# Patient Record
Sex: Male | Born: 1980 | Race: White | Hispanic: No | Marital: Married | Smoking: Never smoker
Health system: Southern US, Community
[De-identification: ages and names within clinical notes are randomized; demographics above are authoritative.]

## PROBLEM LIST (undated history)

## (undated) DIAGNOSIS — F419 Anxiety disorder, unspecified: Secondary | ICD-10-CM

## (undated) HISTORY — PX: KNEE SURGERY: SHX244

---

## 1998-10-08 ENCOUNTER — Emergency Department (HOSPITAL_COMMUNITY): Admission: EM | Admit: 1998-10-08 | Discharge: 1998-10-08 | Payer: Self-pay | Admitting: Emergency Medicine

## 1998-10-08 ENCOUNTER — Encounter: Payer: Self-pay | Admitting: Emergency Medicine

## 2000-04-06 ENCOUNTER — Inpatient Hospital Stay (HOSPITAL_COMMUNITY): Admission: EM | Admit: 2000-04-06 | Discharge: 2000-04-07 | Payer: Self-pay | Admitting: Emergency Medicine

## 2000-04-06 ENCOUNTER — Encounter: Payer: Self-pay | Admitting: Emergency Medicine

## 2006-04-26 ENCOUNTER — Emergency Department (HOSPITAL_COMMUNITY): Admission: EM | Admit: 2006-04-26 | Discharge: 2006-04-27 | Payer: Self-pay | Admitting: Emergency Medicine

## 2007-07-15 ENCOUNTER — Emergency Department (HOSPITAL_COMMUNITY): Admission: EM | Admit: 2007-07-15 | Discharge: 2007-07-15 | Payer: Self-pay | Admitting: Emergency Medicine

## 2008-07-31 ENCOUNTER — Emergency Department (HOSPITAL_COMMUNITY): Admission: EM | Admit: 2008-07-31 | Discharge: 2008-07-31 | Payer: Self-pay | Admitting: Emergency Medicine

## 2010-09-18 ENCOUNTER — Ambulatory Visit (HOSPITAL_COMMUNITY): Admission: RE | Admit: 2010-09-18 | Discharge: 2010-09-18 | Payer: Self-pay | Admitting: Family Medicine

## 2011-02-21 ENCOUNTER — Ambulatory Visit: Payer: Medicaid Other | Attending: Orthopaedic Surgery

## 2011-02-21 DIAGNOSIS — IMO0001 Reserved for inherently not codable concepts without codable children: Secondary | ICD-10-CM | POA: Insufficient documentation

## 2011-02-21 DIAGNOSIS — M6281 Muscle weakness (generalized): Secondary | ICD-10-CM | POA: Insufficient documentation

## 2011-02-21 DIAGNOSIS — R5381 Other malaise: Secondary | ICD-10-CM | POA: Insufficient documentation

## 2011-02-21 DIAGNOSIS — R269 Unspecified abnormalities of gait and mobility: Secondary | ICD-10-CM | POA: Insufficient documentation

## 2011-02-21 DIAGNOSIS — M25569 Pain in unspecified knee: Secondary | ICD-10-CM | POA: Insufficient documentation

## 2011-03-07 ENCOUNTER — Ambulatory Visit: Payer: Medicaid Other | Attending: Orthopaedic Surgery | Admitting: Physical Therapy

## 2011-03-07 DIAGNOSIS — M6281 Muscle weakness (generalized): Secondary | ICD-10-CM | POA: Insufficient documentation

## 2011-03-07 DIAGNOSIS — R5381 Other malaise: Secondary | ICD-10-CM | POA: Insufficient documentation

## 2011-03-07 DIAGNOSIS — R269 Unspecified abnormalities of gait and mobility: Secondary | ICD-10-CM | POA: Insufficient documentation

## 2011-03-07 DIAGNOSIS — M25569 Pain in unspecified knee: Secondary | ICD-10-CM | POA: Insufficient documentation

## 2011-03-07 DIAGNOSIS — IMO0001 Reserved for inherently not codable concepts without codable children: Secondary | ICD-10-CM | POA: Insufficient documentation

## 2011-03-21 ENCOUNTER — Ambulatory Visit: Payer: Medicaid Other | Admitting: Physical Therapy
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
## 2011-09-16 LAB — URINALYSIS, ROUTINE W REFLEX MICROSCOPIC
Bilirubin Urine: NEGATIVE
Hgb urine dipstick: NEGATIVE
Ketones, ur: NEGATIVE
Specific Gravity, Urine: 1.02
pH: 6

## 2012-06-16 ENCOUNTER — Emergency Department (HOSPITAL_COMMUNITY): Payer: Self-pay

## 2012-06-16 ENCOUNTER — Emergency Department (HOSPITAL_COMMUNITY)
Admission: EM | Admit: 2012-06-16 | Discharge: 2012-06-16 | Disposition: A | Discharge status: Home / Self Care | Payer: Self-pay | Attending: Emergency Medicine | Admitting: Emergency Medicine

## 2012-06-16 ENCOUNTER — Encounter (HOSPITAL_COMMUNITY): Payer: Self-pay | Admitting: *Deleted

## 2012-06-16 DIAGNOSIS — R110 Nausea: Secondary | ICD-10-CM | POA: Insufficient documentation

## 2012-06-16 DIAGNOSIS — R109 Unspecified abdominal pain: Secondary | ICD-10-CM

## 2012-06-16 DIAGNOSIS — R000 Tachycardia, unspecified: Secondary | ICD-10-CM | POA: Insufficient documentation

## 2012-06-16 DIAGNOSIS — R509 Fever, unspecified: Secondary | ICD-10-CM | POA: Insufficient documentation

## 2012-06-16 DIAGNOSIS — K2970 Gastritis, unspecified, without bleeding: Secondary | ICD-10-CM | POA: Insufficient documentation

## 2012-06-16 DIAGNOSIS — R1011 Right upper quadrant pain: Secondary | ICD-10-CM | POA: Insufficient documentation

## 2012-06-16 LAB — CBC
Hemoglobin: 15.2 g/dL (ref 13.0–17.0)
MCH: 26.5 pg (ref 26.0–34.0)
MCHC: 32.7 g/dL (ref 30.0–36.0)
MCV: 81 fL (ref 78.0–100.0)
RBC: 5.74 MIL/uL (ref 4.22–5.81)

## 2012-06-16 LAB — COMPREHENSIVE METABOLIC PANEL
Alkaline Phosphatase: 57 U/L (ref 39–117)
BUN: 9 mg/dL (ref 6–23)
Calcium: 9.6 mg/dL (ref 8.4–10.5)
Creatinine, Ser: 1.19 mg/dL (ref 0.50–1.35)
GFR calc Af Amer: >90 mL/min (ref >90)
Glucose, Bld: 92 mg/dL (ref 70–99)
Total Protein: 7.6 g/dL (ref 6.0–8.3)

## 2012-06-16 LAB — URINALYSIS, ROUTINE W REFLEX MICROSCOPIC
Ketones, ur: NEGATIVE mg/dL
Leukocytes, UA: NEGATIVE
Nitrite: NEGATIVE
Specific Gravity, Urine: 1.018 (ref 1.005–1.030)
pH: 6 (ref 5.0–8.0)

## 2012-06-16 LAB — DIFFERENTIAL
Basophils Relative: 0 % (ref 0–1)
Eosinophils Absolute: 0 10*3/uL (ref 0.0–0.7)
Eosinophils Relative: 0 % (ref 0–5)
Lymphs Abs: 0.7 10*3/uL (ref 0.7–4.0)
Monocytes Absolute: 1 10*3/uL (ref 0.1–1.0)
Monocytes Relative: 19 % — ABNORMAL HIGH (ref 3–12)

## 2012-06-16 LAB — LIPASE, BLOOD: Lipase: 36 U/L (ref 11–59)

## 2012-06-16 MED ORDER — HYDROMORPHONE HCL PF 1 MG/ML IJ SOLN
1.0000 mg | Freq: Once | INTRAMUSCULAR | Status: AC
  Administered 2012-06-16: 1 mg via INTRAVENOUS
  Filled 2012-06-16: qty 1

## 2012-06-16 MED ORDER — ONDANSETRON HCL 4 MG/2ML IJ SOLN
4.0000 mg | Freq: Once | INTRAMUSCULAR | Status: AC
  Administered 2012-06-16: 4 mg via INTRAVENOUS
  Filled 2012-06-16: qty 2

## 2012-06-16 MED ORDER — OXYCODONE HCL 5 MG PO TABS: 5.0000 mg | ORAL_TABLET | ORAL | Status: AC | PRN

## 2012-06-16 MED ORDER — SODIUM CHLORIDE 0.9 % IV BOLUS (SEPSIS)
1000.0000 mL | Freq: Once | INTRAVENOUS | Status: AC
  Administered 2012-06-16: 1000 mL via INTRAVENOUS

## 2012-06-16 MED ORDER — SUCRALFATE 1 GM/10ML PO SUSP: 1.0000 g | Freq: Four times a day (QID) | ORAL | Status: DC

## 2012-06-16 MED ORDER — GI COCKTAIL ~~LOC~~
30.0000 mL | Freq: Once | ORAL | Status: AC
  Administered 2012-06-16: 30 mL via ORAL
  Filled 2012-06-16: qty 30

## 2012-06-16 MED ORDER — FAMOTIDINE-CA CARB-MAG HYDROX 10-800-165 MG PO CHEW: 1.0000 | CHEWABLE_TABLET | Freq: Two times a day (BID) | ORAL | Status: DC

## 2012-06-16 NOTE — ED Notes (Signed)
Pt reports right upper quadrant pain x couple of weeks. Nausea, no appetite. Fever 99.9. States "googled symptoms and thinks I have gallstones". Hx of enlarged liver.

## 2012-06-16 NOTE — ED Provider Notes (Addendum)
History     CSN: [id number]  Arrival date & time 06/16/12  1505   First MD Initiated Contact with Patient 06/16/12 1547      Chief Complaint  Patient presents with  . Abdominal Pain    (Consider location/radiation/quality/duration/timing/severity/associated sxs/prior treatment) Patient is a 31 y.o. male presenting with abdominal pain. The history is provided by the patient.  Abdominal Pain The primary symptoms of the illness include abdominal pain, fever and nausea. The primary symptoms of the illness do not include vomiting or diarrhea. Episode onset: 2 weeks ago intermittent but worse over the last 2 days. The onset of the illness was gradual. The problem has been gradually worsening.  The abdominal pain is located in the RUQ. The abdominal pain does not radiate. The severity of the abdominal pain is 9/10. The abdominal pain is relieved by nothing. The abdominal pain is exacerbated by movement, eating and certain positions.  The fever began 2 days ago. The fever has been gradually worsening since its onset. The maximum temperature recorded prior to his arrival was 100 to 100.9 F. The temperature was taken by an oral thermometer.  Associated with: last alcohol use was 4 days ago and drank 7 beers. The patient has not had a change in bowel habit. Additional symptoms associated with the illness include chills and anorexia. Symptoms associated with the illness do not include constipation, urgency, frequency or back pain. Associated medical issues comments: fatty liver discovered 3 years ago.    History reviewed. No pertinent past medical history.  Past Surgical History  Procedure Date  . Knee surgery     No family history on file.  History  Substance Use Topics  . Smoking status: Never Smoker   . Smokeless tobacco: Not on file  . Alcohol Use: Yes      Review of Systems  Constitutional: Positive for fever and chills.  Gastrointestinal: Positive for nausea, abdominal pain and  anorexia. Negative for vomiting, diarrhea and constipation.  Genitourinary: Negative for urgency and frequency.  Musculoskeletal: Negative for back pain.  All other systems reviewed and are negative.    Allergies  Review of patient's allergies indicates no known allergies.  Home Medications  No current outpatient prescriptions on file.  BP 148/84  Pulse 115  Temp 99.5 F (37.5 C) (Oral)  Resp 16  SpO2 100%  Physical Exam  Nursing note and vitals reviewed. Constitutional: He is oriented to person, place, and time. He appears well-developed and well-nourished. No distress.  HENT:  Head: Normocephalic and atraumatic.  Mouth/Throat: Oropharynx is clear and moist.  Eyes: Conjunctivae and EOM are normal. Pupils are equal, round, and reactive to light.  Neck: Normal range of motion. Neck supple.  Cardiovascular: Normal rate, regular rhythm and intact distal pulses.   No murmur heard. Pulmonary/Chest: Effort normal and breath sounds normal. No respiratory distress. He has no wheezes. He has no rales.  Abdominal: Soft. Normal appearance. He exhibits no distension. There is tenderness in the right upper quadrant. There is guarding and positive Murphy's sign. There is no rebound and no CVA tenderness. No hernia.  Musculoskeletal: Normal range of motion. He exhibits no edema and no tenderness.  Neurological: He is alert and oriented to person, place, and time.  Skin: Skin is warm and dry. No rash noted. No erythema.  Psychiatric: He has a normal mood and affect. His behavior is normal.    ED Course  Procedures (including critical care time)  Labs Reviewed  DIFFERENTIAL - Abnormal; Notable  for the following:    Monocytes Relative 19 (*)     All other components within normal limits  COMPREHENSIVE METABOLIC PANEL - Abnormal; Notable for the following:    GFR calc non Af Amer 79 (*)     All other components within normal limits  CBC  LIPASE, BLOOD  URINALYSIS, ROUTINE W REFLEX  MICROSCOPIC   Dg Chest 2 View  06/16/2012  *RADIOLOGY REPORT*  Clinical Data:  Fever and abdominal pain.  CHEST - 2 VIEW  Comparison: 07/15/2007  Findings: The heart size and mediastinal contours are within normal limits.  Both lungs are clear.  The visualized skeletal structures are unremarkable.  IMPRESSION: No active disease.  Original Report Authenticated By: Reola Calkins, M.D.   US Abdomen Complete  06/16/2012  *RADIOLOGY REPORT*  Clinical Data:  Abdominal pain and nausea.  COMPLETE ABDOMINAL ULTRASOUND  Comparison:  CT scan dated 07/15/2007  Findings:  Gallbladder:  No gallstones, gallbladder wall thickening, or pericholecystic fluid. Negative sonographic Murphy's sign.  Common bile duct:  Normal.  1.8 mm in diameter.  Liver:  Normal.  IVC:  Normal.  Pancreas:  Normal.  Spleen:  Splenomegaly.  Length is 15.3 cm. Volume is 72 cc.  Right Kidney:  Normal.  11.0 cm in length.  Left Kidney:  Normal.  10.8 cm in length.  Abdominal aorta:  No aneurysm identified.  Maximum diameter is 2.1 cm.  The mid to distal aorta is obscured by bowel gas.  IMPRESSION: Slight nonspecific splenomegaly.  Distal aorta is not visualized.  Original Report Authenticated By: Gwynn Burly, M.D.     1. Abdominal  pain, other specified site       MDM   With abdominal pain, fever and tachycardia.  Pain is in the right upper pain is most consistent with cholecystitis. Patient states the symptoms have been intermittent for 2 weeks but worse in the last 24 hours. Patient does have a history of fatty liver and isn't intermittent heavy drinker. His symptoms are not consistent with pancreatitis at this time. The last time he drank any alcohol was noted when he drank 8 beers.  patient is mildly tachycardic on exam and has a low-grade fever.  Get right upper quadrant tenderness on exam.  CBC, CMP, lipase, ultrasound of the abdomen pending. Patient given IV fluids, pain and nausea control.   5:30 PM Pt with normal U/S  without acute findings.  Pt states he persists with pain.  No sx suggestive of PE and PERC neg.  Will get CXR to r/o pathology in the [facility].  Pt may be having gastritis.  5:56 PM CXR wnl.  Will d/c home with ttx for gastritis and will have f/u with GI.  Gwyneth Sprout, MD 06/16/12 1757  Gwyneth Sprout, MD 06/16/12 1610

## 2012-08-06 ENCOUNTER — Emergency Department (HOSPITAL_COMMUNITY)
Admission: EM | Admit: 2012-08-06 | Discharge: 2012-08-06 | Disposition: A | Discharge status: Home / Self Care | Payer: Self-pay | Attending: Emergency Medicine | Admitting: Emergency Medicine

## 2012-08-06 ENCOUNTER — Encounter (HOSPITAL_COMMUNITY): Payer: Self-pay | Admitting: Emergency Medicine

## 2012-08-06 DIAGNOSIS — IMO0002 Reserved for concepts with insufficient information to code with codable children: Secondary | ICD-10-CM | POA: Insufficient documentation

## 2012-08-06 DIAGNOSIS — L0390 Cellulitis, unspecified: Secondary | ICD-10-CM

## 2012-08-06 MED ORDER — HYDROCODONE-ACETAMINOPHEN 5-325 MG PO TABS: 1.0000 | ORAL_TABLET | ORAL | Status: AC | PRN

## 2012-08-06 MED ORDER — SULFAMETHOXAZOLE-TRIMETHOPRIM 800-160 MG PO TABS: 2.0000 | ORAL_TABLET | Freq: Two times a day (BID) | ORAL | Status: AC

## 2012-08-06 NOTE — ED Provider Notes (Signed)
History     CSN: [id number]  Arrival date & time 08/06/12  4098   First MD Initiated Contact with Patient 08/06/12 0830      Chief Complaint  Patient presents with  . arm infection     (Consider location/radiation/quality/duration/timing/severity/associated sxs/prior treatment) HPI Comments: Patient presents with six red spots on his right forearm. Patient states his wife had an abscess on her knee 4 weeks ago, came to the ED and had it drained and it healed. Patient notes a few days later he had a red spot on his arm that he squeezed draining "pus and blood". A second one popped up after the initial one healed, then a third. Patient thought the 3 spots were healed and shaved his arm one week ago and 3 new spots appeared in the past 3 days. He has been washing his arm with antibacterial soap, cleaning each area with hydrogen peroxide, alcohol, and epsom salts, then covering each area with manuka honey and bandages. Patient used SilvaSorb on one spot that he believes helped it heal. Patient states he is worried that it could be MRSA. He has been taking his wife's Vicodin for the pain, pain is described as a pressure at the spots. He also notes some swelling in his right forearm since last night. Denies fevers, chills, N/V/D, tingling or numbness in his right hand.  The history is provided by the patient.    History reviewed. No pertinent past medical history.  Past Surgical History  Procedure Date  . Knee surgery     History reviewed. No pertinent family history.  History  Substance Use Topics  . Smoking status: Never Smoker   . Smokeless tobacco: Not on file  . Alcohol Use: Yes     socially      Review of Systems  Constitutional: Negative for fever and chills.  Gastrointestinal: Negative for nausea, vomiting and diarrhea.  Neurological: Negative for weakness and numbness.    Allergies  Review of patient's allergies indicates no known allergies.  Home Medications    Current Outpatient Rx  Name Route Sig Dispense Refill  . CLONAZEPAM 1 MG PO TABS Oral Take 1 mg by mouth 2 (two) times daily as needed.     Marland Kitchen DEXMETHYLPHENIDATE HCL 10 MG PO TABS Oral Take 10 mg by mouth daily as needed. For attention disorder    . HYDROCODONE-ACETAMINOPHEN 5-500 MG PO TABS Oral Take 1 tablet by mouth every 6 (six) hours as needed. One time dose prn pain. Pt used med from old rx    . LAMOTRIGINE 100 MG PO TABS Oral Take 50 mg by mouth daily.     Marland Kitchen OMEPRAZOLE 10 MG PO CPDR Oral Take 10 mg by mouth daily.    . TESTOSTERONE CYPIONATE 100 MG/ML IM OIL Intramuscular Inject 120 mg into the muscle See admin instructions. For IM use only 120 mg every 5 days      BP 139/56  Pulse 95  Temp 98 F (36.7 C) (Oral)  Resp 16  SpO2 100%  Physical Exam  Nursing note and vitals reviewed. Constitutional: He appears well-developed and well-nourished. No distress.  HENT:  Head: Normocephalic and atraumatic.  Neck: Neck supple.  Pulmonary/Chest: Effort normal.  Neurological: He is alert.  Skin: He is not diaphoretic.       Multiple pustular lesions of right dorsal forearm in various stages of healing.  At distal forearm there is a larger lesion with induration and surrounding erythema.  ED Course  Procedures (including critical care time)  Labs Reviewed - No data to display No results found.  INCISION AND DRAINAGE Performed by: Trixie Dredge B Consent: Verbal consent obtained. Risks and benefits: risks, benefits and alternatives were discussed Type: abscess  Body area: right dorsal forearm  Anesthesia: local infiltration  Local anesthetic: lidocaine 2% no epinephrine  Anesthetic total: 3 ml  Complexity: complex Blunt dissection to break up loculations  Drainage: bloody with flecks of purulence  Drainage amount: small  Packing material: none  Patient tolerance: Patient tolerated the procedure well with no immediate complications.     1. Cellulitis and  abscess       MDM  Pt with multiple areas of abscess, most have drained spontaneously, a larger one over distal dorsal forearm I&D'd in ED.  Also has surrounding cellulitis.  Pt is afebrile, nontoxic.  Not immunocompromised.  D/C home with bactrim and norco, 2 day follow up.  Discussed diagnosis and care plan with patient.  Pt given return precautions.  Pt verbalizes understanding and agrees with plan.           [place], [place] 08/06/12 1011

## 2012-08-06 NOTE — ED Notes (Signed)
Has 6 spots on right arm from abcesses- one healed, green drainage, wife had abscess on knee 1 month ago-- was treated, but did not take all antibiotics, pt had area start on right forearm 2-3 weeks ago, now has 5-6 abscess areas. Shaved arms with electric razor a few days ago. Swelling at wrist area.

## 2012-08-06 NOTE — ED Provider Notes (Signed)
Medical screening examination/treatment/procedure(s) were performed by non-physician practitioner and as supervising physician I was immediately available for consultation/collaboration.   Zamier Eggebrecht E Eyva Califano, MD 08/06/12 1531 

## 2012-08-06 NOTE — Discharge Instructions (Signed)
Read the information below.  Please see your doctor, the urgent care, or return to the ER in two days for a recheck of your arm.  Use warm moist compresses on your arm several times a day to encourage drainage.  If you develop fevers, spreading redness, worsening swelling, or uncontrolled pain, return to the ER immediately for a recheck.  Use the prescribed medication as directed.  Please discuss all new medications with your pharmacist.  Do not take additional tylenol while taking the prescribed pain medication to avoid overdose.  You may return to the Emergency Department at any time for worsening condition or any new symptoms that concern you.    Abscess An abscess (boil or furuncle) is an infected area that contains a collection of pus.  SYMPTOMS Signs and symptoms of an abscess include pain, tenderness, redness, or hardness. You may feel a moveable soft area under your skin. An abscess can occur anywhere in the body.  TREATMENT  A surgical cut (incision) may be made over your abscess to drain the pus. Gauze may be packed into the space or a drain may be looped through the abscess cavity (pocket). This provides a drain that will allow the cavity to heal from the inside outwards. The abscess may be painful for a few days, but should feel much better if it was drained.  Your abscess, if seen early, may not have localized and may not have been drained. If not, another appointment may be required if it does not get better on its own or with medications. HOME CARE INSTRUCTIONS   Only take over-the-counter or prescription medicines for pain, discomfort, or fever as directed by your caregiver.   Take your antibiotics as directed if they were prescribed. Finish them even if you start to feel better.   Keep the skin and clothes clean around your abscess.   If the abscess was drained, you will need to use gauze dressing to collect any draining pus. Dressings will typically need to be changed 3 or more  times a day.   The infection may spread by skin contact with others. Avoid skin contact as much as possible.   Practice good hygiene. This includes regular hand washing, cover any draining skin lesions, and do not share personal care items.   If you participate in sports, do not share athletic equipment, towels, whirlpools, or personal care items. Shower after every practice or tournament.   If a draining area cannot be adequately covered:   Do not participate in sports.   Children should not participate in day care until the wound has healed or drainage stops.   If your caregiver has given you a follow-up appointment, it is very important to keep that appointment. Not keeping the appointment could result in a much worse infection, chronic or permanent injury, pain, and disability. If there is any problem keeping the appointment, you must call back to this facility for assistance.  SEEK MEDICAL CARE IF:   You develop increased pain, swelling, redness, drainage, or bleeding in the wound site.   You develop signs of generalized infection including muscle aches, chills, fever, or a general ill feeling.   You have an oral temperature above 102 F (38.9 C).  MAKE SURE YOU:   Understand these instructions.   Will watch your condition.   Will get help right away if you are not doing well or get worse.  Document Released: 08/28/2005 Document Revised: 11/07/2011 Document Reviewed: 06/21/2008 [facility] Patient Information 2012 [employer].  Cellulitis Cellulitis is an infection of the skin and the tissue beneath it. The area is typically red and tender. It is caused by germs (bacteria) (usually staph or strep) that enter the body through cuts or sores. Cellulitis most commonly occurs in the arms or lower legs.  HOME CARE INSTRUCTIONS   If you are given a prescription for medications which kill germs (antibiotics), take as directed until finished.   If the infection is on the arm or  leg, keep the limb elevated as able.   Use a warm cloth several times per day to relieve pain and encourage healing.   See your caregiver for recheck of the infected site as directed if problems arise.   Only take over-the-counter or prescription medicines for pain, discomfort, or fever as directed by your caregiver.  SEEK MEDICAL CARE IF:   The area of redness (inflammation) is spreading, there are red streaks coming from the infected site, or if a part of the infection begins to turn dark in color.   The joint or bone underneath the infected skin becomes painful after the skin has healed.   The infection returns in the same or another area after it seems to have gone away.   A boil or bump swells up. This may be an abscess.   New, unexplained problems such as pain or fever develop.  SEEK IMMEDIATE MEDICAL CARE IF:   You have a fever.   You or your child feels drowsy or lethargic.   There is vomiting, diarrhea, or lasting discomfort or feeling ill (malaise) with muscle aches and pains.  MAKE SURE YOU:   Understand these instructions.   Will watch your condition.   Will get help right away if you are not doing well or get worse.  Document Released: 08/28/2005 Document Revised: 11/07/2011 Document Reviewed: 07/06/2008 [facility] Patient Information 2012 [place], [place].

## 2013-04-21 ENCOUNTER — Telehealth: Payer: Self-pay | Admitting: Family Medicine

## 2013-04-21 NOTE — Telephone Encounter (Signed)
Pt would like to be a patient of yours. Pt's wife said he had been a patient of yours years ago, but we cannot find any record of him. Also, this pt comes up as being discharged from the practice, but we cannot find a discharge letter. Pt is SELF PAY.  Pt's wife, Jayson Waterhouse had asked to be your patient, also self pay. Mrs Diehl said she would be willing to give up her appointment and let him be the new pt and take her appt day and time if you will approve. Pls advise.

## 2013-05-07 NOTE — Telephone Encounter (Signed)
Per Dr Tawanna Cooler, he will not accept as a patient.

## 2013-10-10 IMAGING — CR DG CHEST 2V
2 series · 2 of 2 positions shown · non-contrast
Comparison: 07/15/2007

CLINICAL DATA: Fever and abdominal pain.

CHEST - 2 VIEW

[w chest pa]
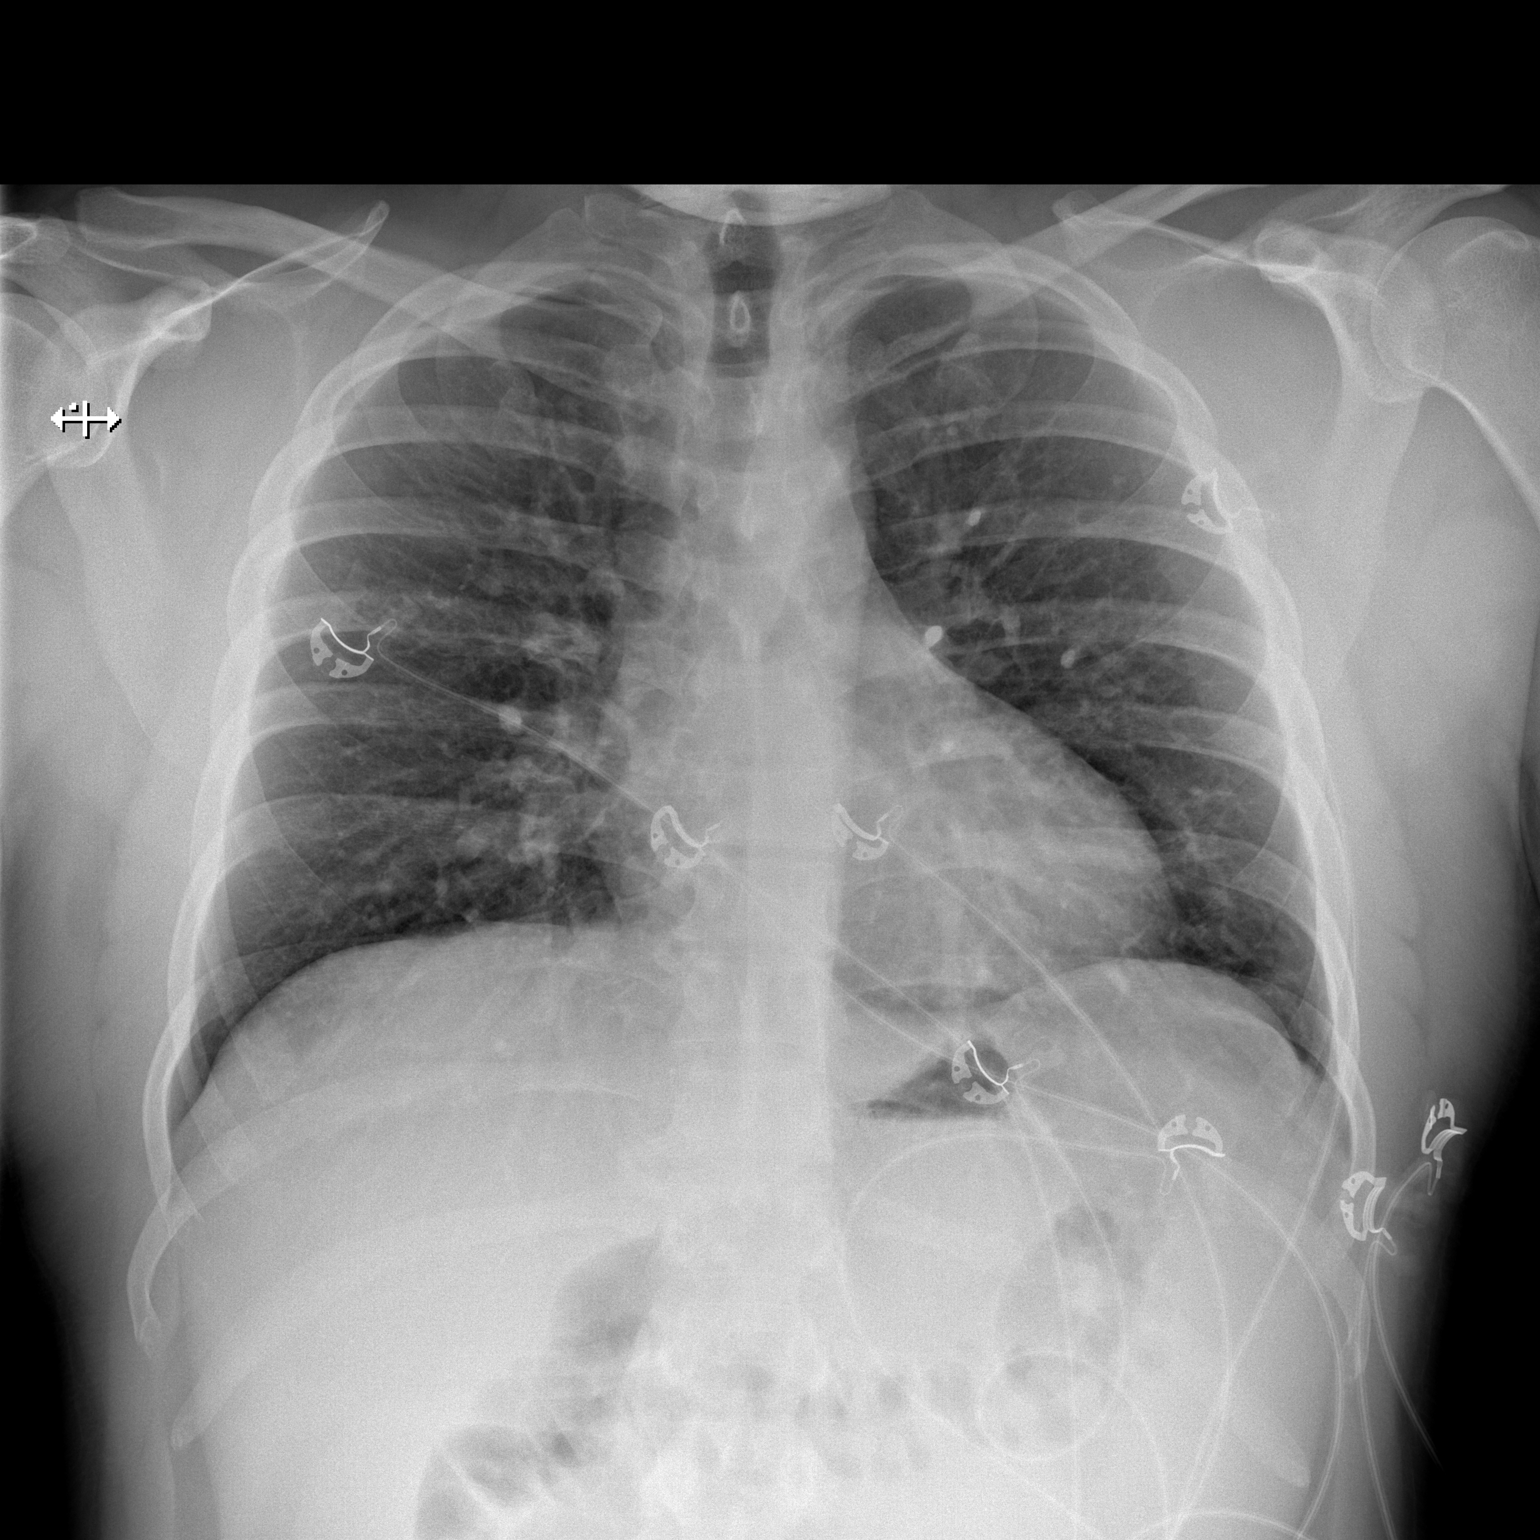

[w chest lat]
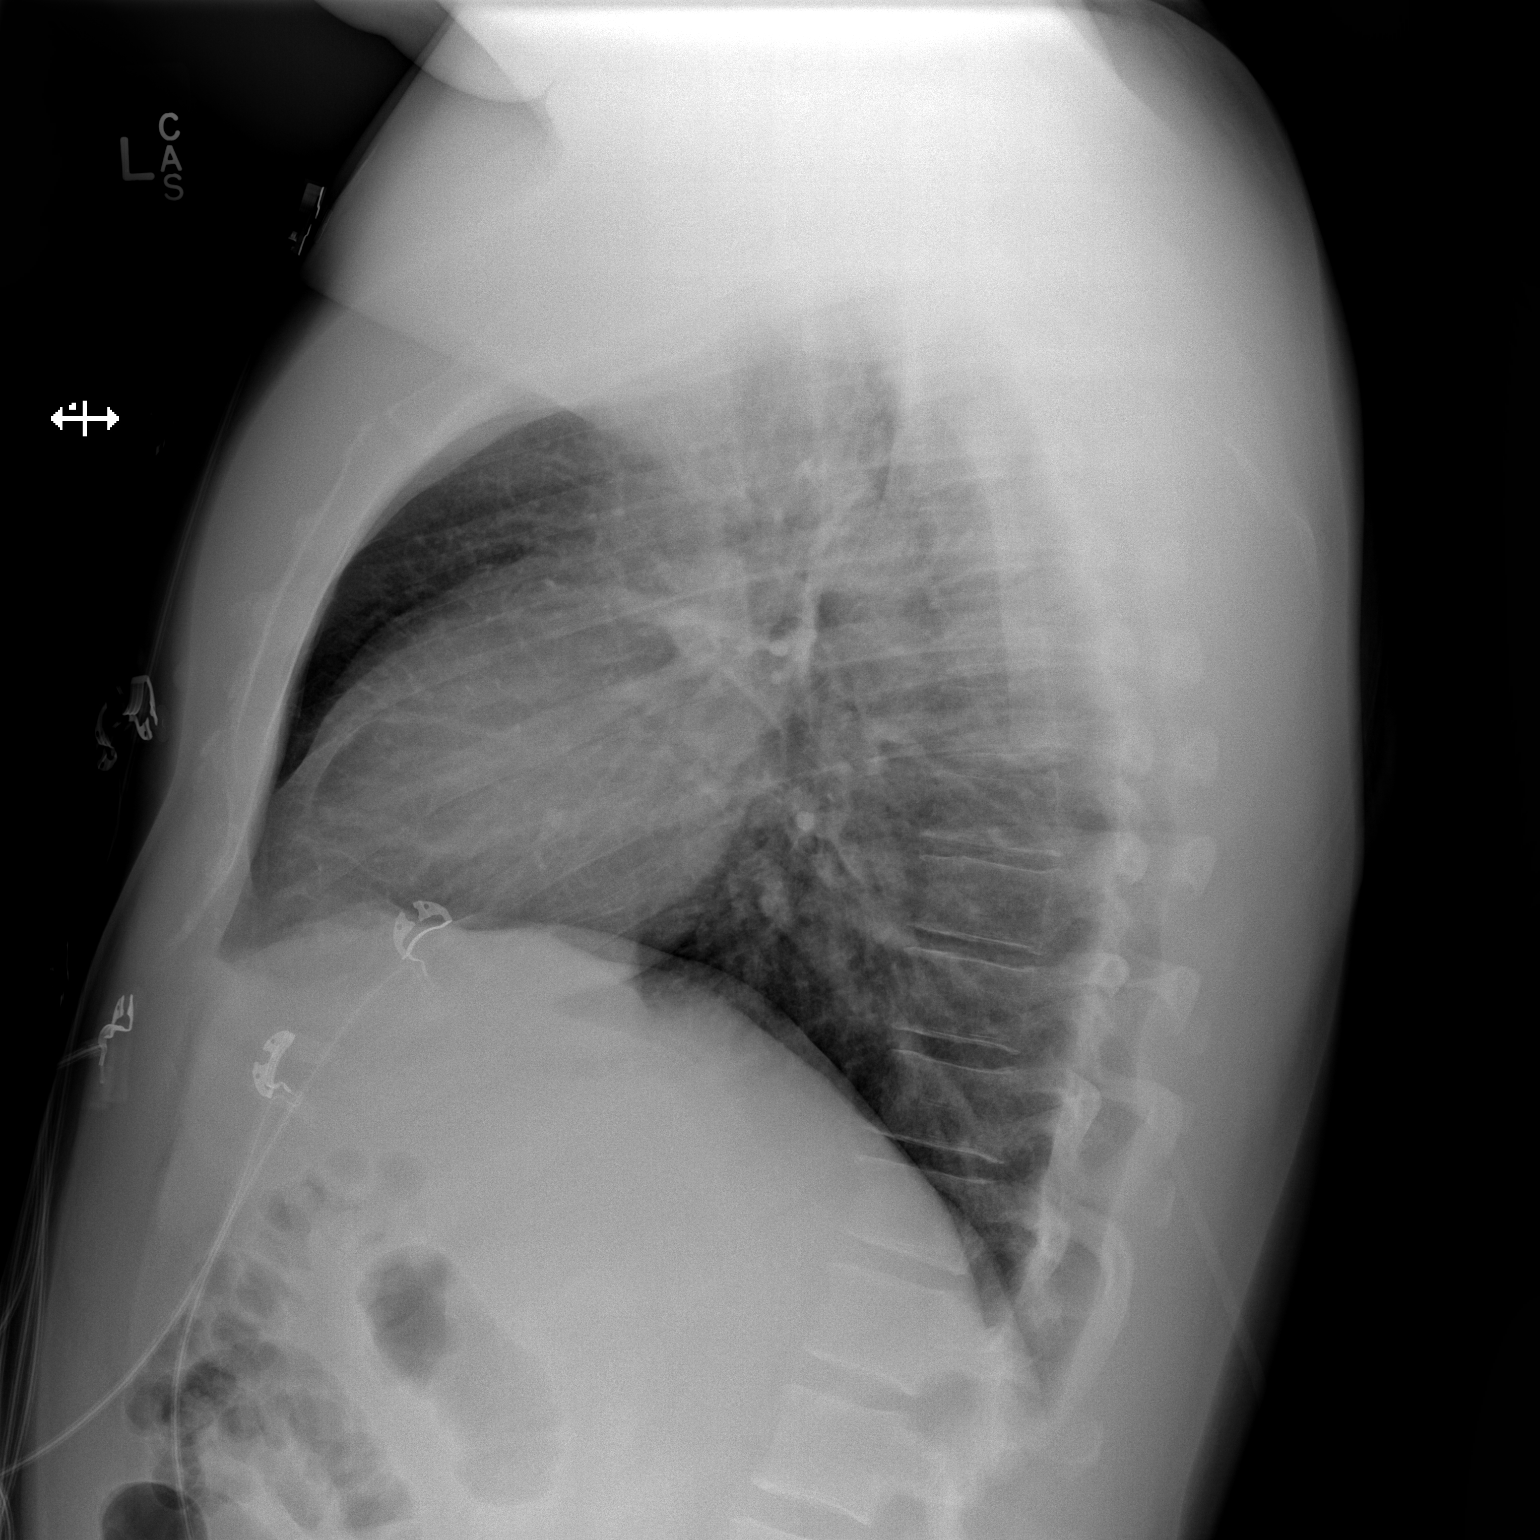

[2 of 2 positions shown; findings below may reference images not displayed]

FINDINGS: The heart size and mediastinal contours are within normal
limits.  Both lungs are clear.  The visualized skeletal structures
are unremarkable.
IMPRESSION: No active disease.

## 2013-10-10 IMAGING — US US ABDOMEN COMPLETE
1 series · 14 of 25 positions shown · non-contrast
Comparison: CT scan dated 07/15/2007

CLINICAL DATA: Abdominal pain and nausea.

COMPLETE ABDOMINAL ULTRASOUND

[Series 1: us abdomen complete · 0.43mm/px · 14 of 82 slices shown]
[im 1/82]
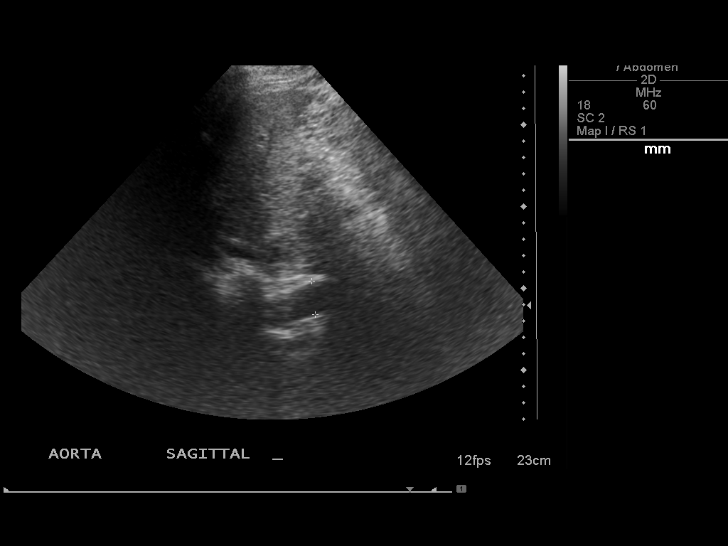
[im 7/82]
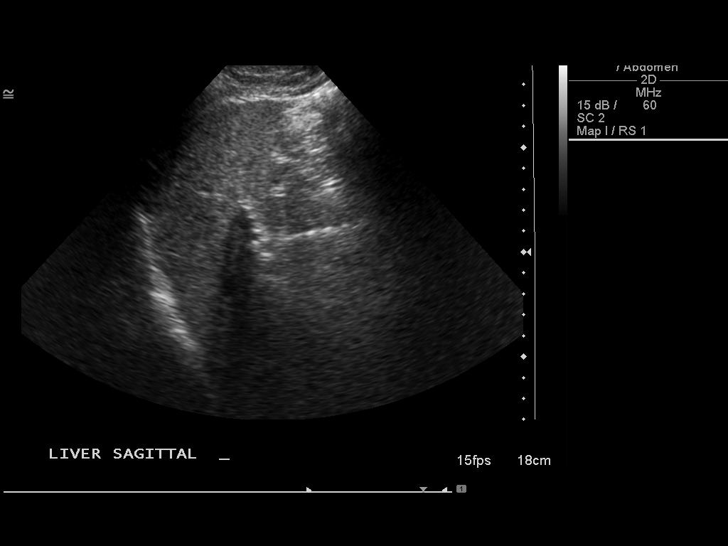
[im 14/82]
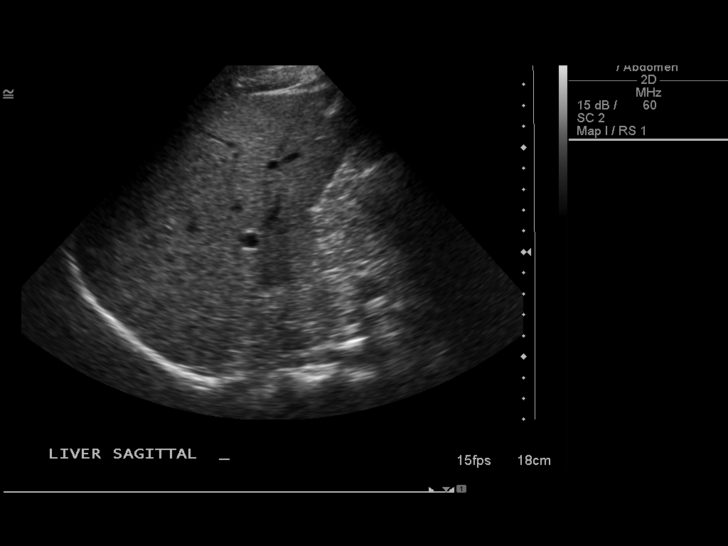
[im 21/82]
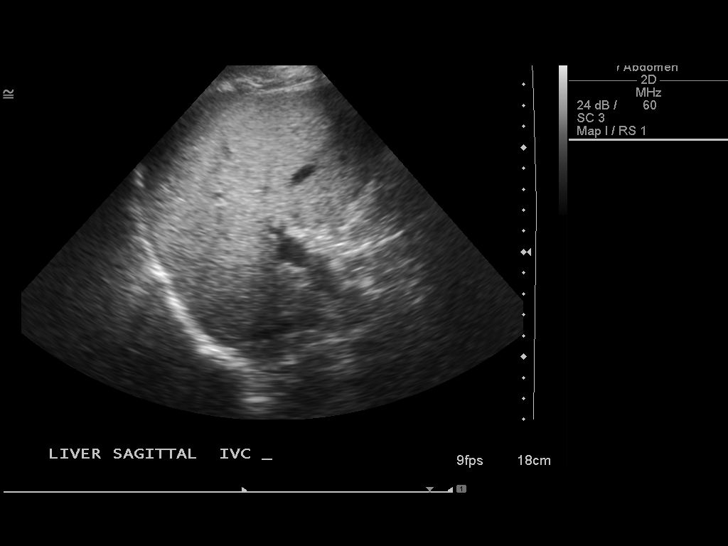
[im 28/82]
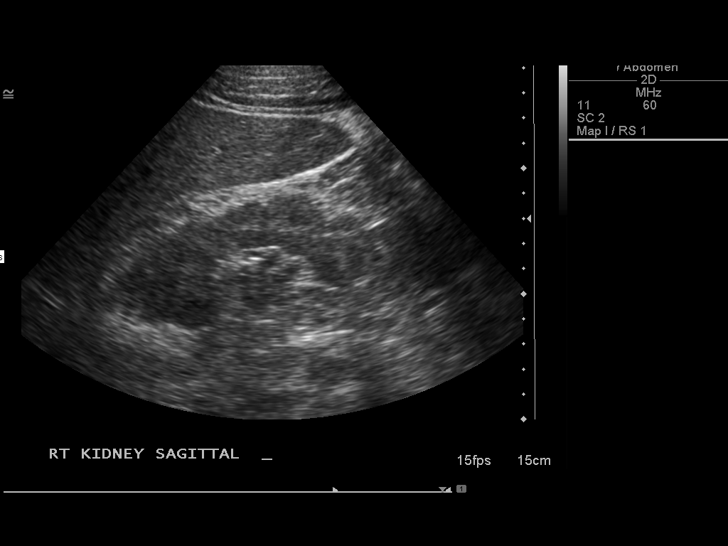
[im 31/82]
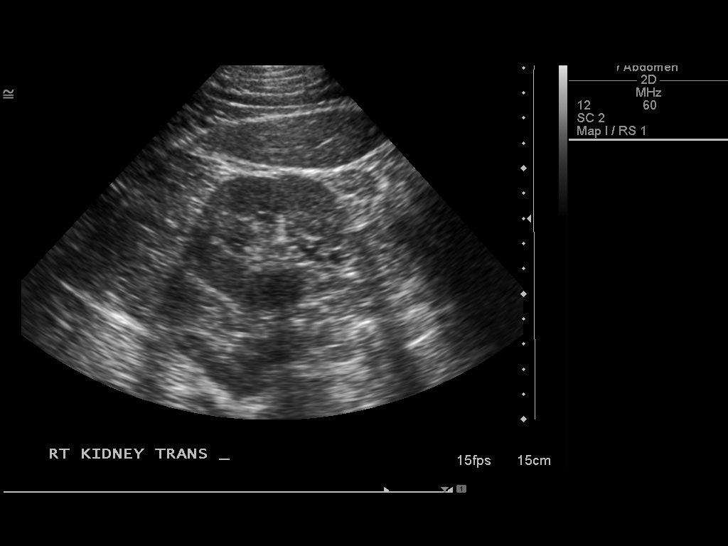
[im 38/82]
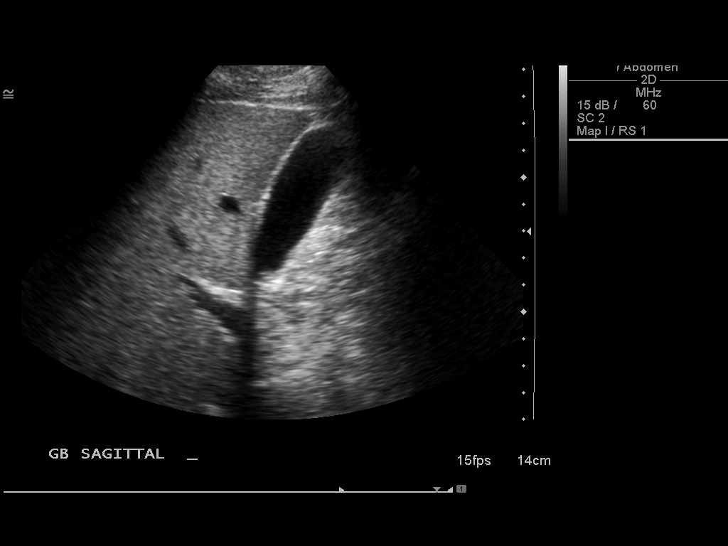
[im 44/82]
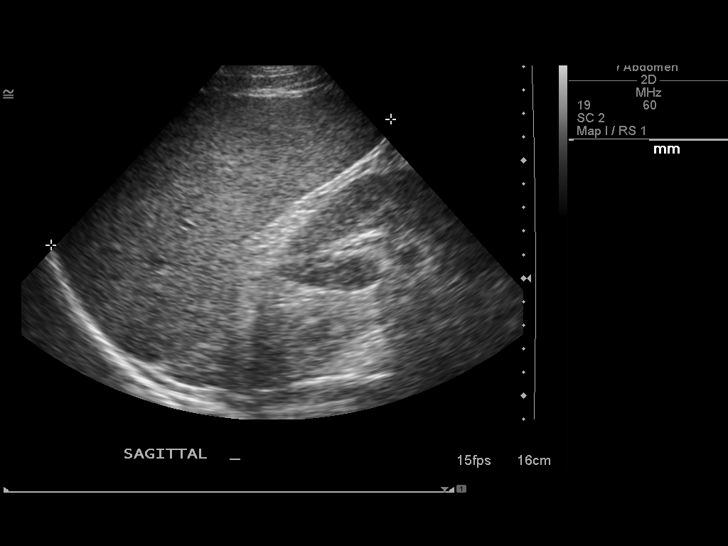
[im 51/82]
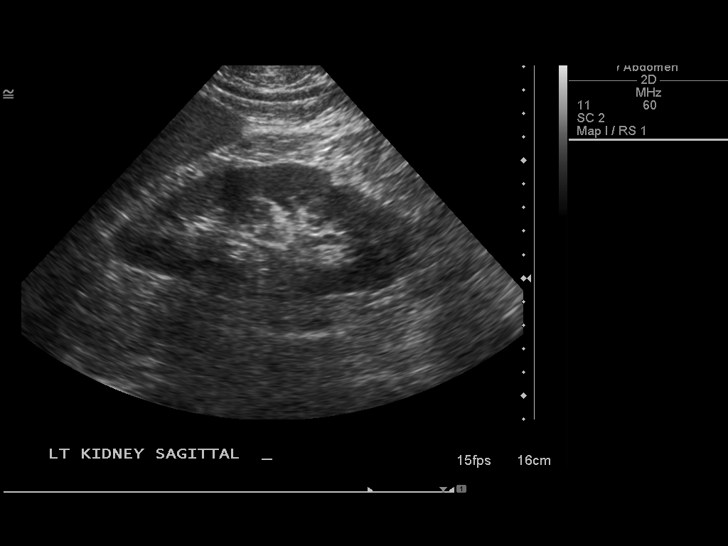
[im 55/82]
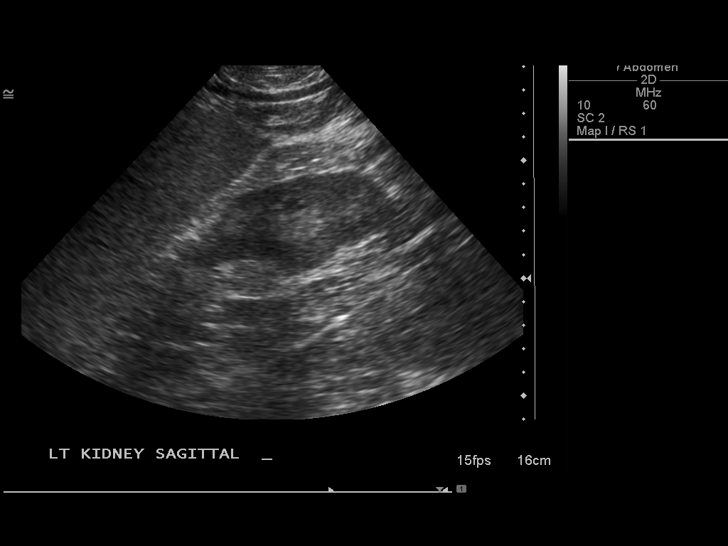
[im 61/82]
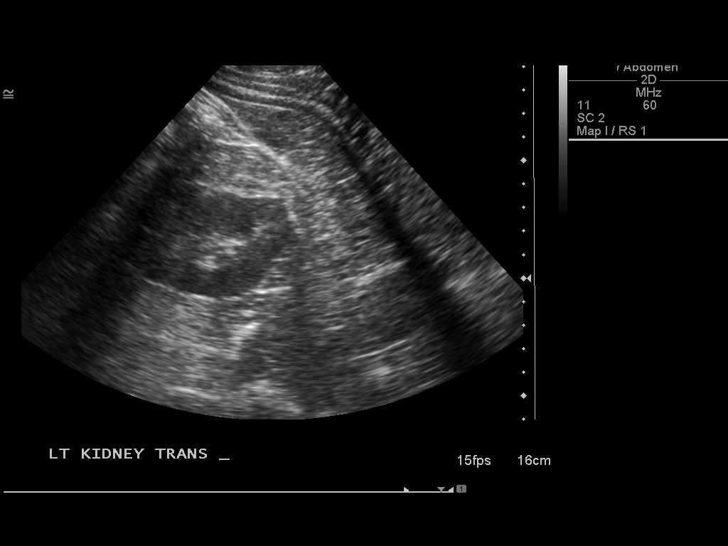
[im 68/82]
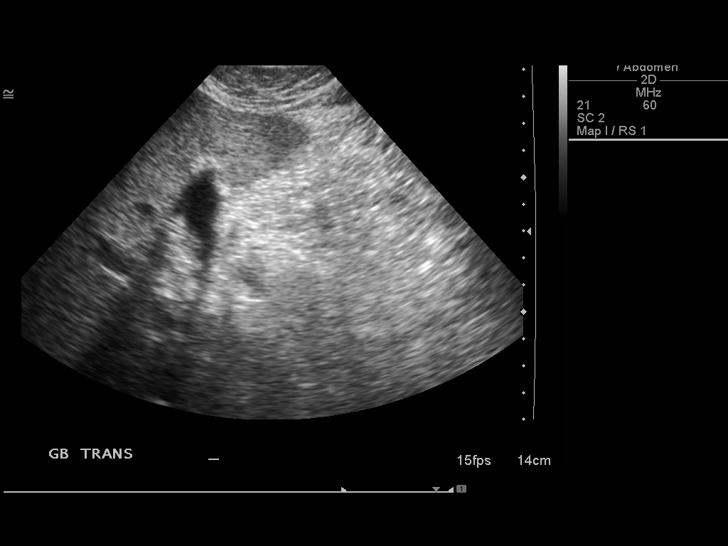
[im 75/82]
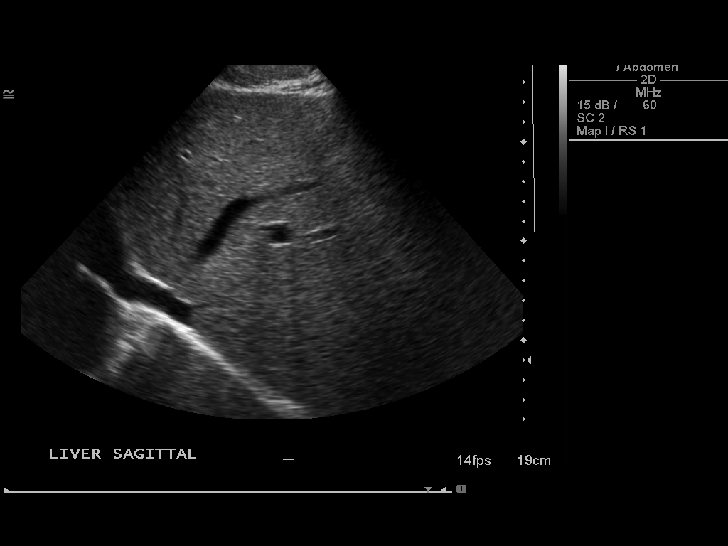
[im 82/82]
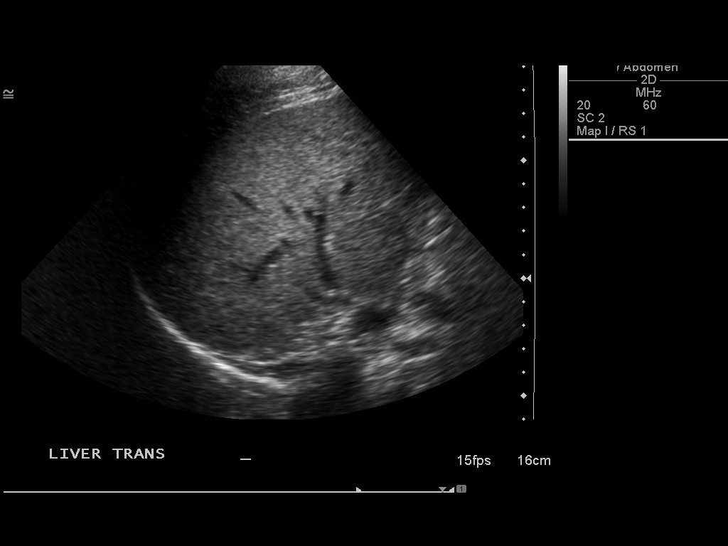

[14 of 25 positions shown; findings below may reference images not displayed]

FINDINGS: Gallbladder:  No gallstones, gallbladder wall thickening, or
pericholecystic fluid. Negative sonographic Murphy's sign.

Common bile duct:  Normal.  1.8 mm in diameter.

Liver:  Normal.

IVC:  Normal.

Pancreas:  Normal.

Spleen:  Splenomegaly.  Length is 15.3 cm. Volume is 72 cc.

Right Kidney:  Normal.  11.0 cm in length.

Left Kidney:  Normal.  10.8 cm in length.

Abdominal aorta:  No aneurysm identified.  Maximum diameter is
cm.  The mid to distal aorta is obscured by bowel gas.
IMPRESSION: Slight nonspecific splenomegaly.  Distal aorta is not visualized.

## 2016-04-05 ENCOUNTER — Emergency Department (HOSPITAL_BASED_OUTPATIENT_CLINIC_OR_DEPARTMENT_OTHER)
Admission: EM | Admit: 2016-04-05 | Discharge: 2016-04-05 | Disposition: A | Discharge status: Home / Self Care | Payer: BC Managed Care – PPO | Attending: Emergency Medicine | Admitting: Emergency Medicine

## 2016-04-05 ENCOUNTER — Encounter (HOSPITAL_BASED_OUTPATIENT_CLINIC_OR_DEPARTMENT_OTHER): Payer: Self-pay | Admitting: *Deleted

## 2016-04-05 DIAGNOSIS — F419 Anxiety disorder, unspecified: Secondary | ICD-10-CM | POA: Insufficient documentation

## 2016-04-05 HISTORY — DX: Anxiety disorder, unspecified: F41.9

## 2016-04-05 MED ORDER — DIAZEPAM 5 MG PO TABS
5.0000 mg | ORAL_TABLET | Freq: Once | ORAL | Status: DC
Start: 2016-04-05 — End: 2022-12-24

## 2016-04-05 MED ORDER — DIAZEPAM 5 MG PO TABS
5.0000 mg | ORAL_TABLET | Freq: Once | ORAL | Status: AC
  Administered 2016-04-05: 5 mg via ORAL
  Filled 2016-04-05: qty 1

## 2016-04-05 NOTE — ED Notes (Signed)
Pt given rx x 1 for valium. Resources given for f/u care. Ambulatory at discharge. Denies SI/HI

## 2016-04-05 NOTE — Discharge Instructions (Signed)
It is important for you to follow-up with your doctor for medication management. Return to ED for any new or worsening symptoms as we discussed.

## 2016-04-05 NOTE — ED Notes (Signed)
States he ran out of Xanax and needs a Rx. States he is going through withdrawals.

## 2016-04-05 NOTE — ED Provider Notes (Signed)
CSN: [id number]     Arrival date & time 04/05/16  1052 History   First MD Initiated Contact with Patient 04/05/16 1106     Chief Complaint  Patient presents with  . Medication Reaction     (Consider location/radiation/quality/duration/timing/severity/associated sxs/prior Treatment) HPI Andrew Johnson is a 35 y.o. male who comes in for evaluation of anxiety. Patient reports he is experiencing increased anxiety secondary to a change and testosterone that is prescribed by his PCP. He reports he is also prescribed Xanax by his PCP. He reports running out of his Xanax prescription, last taking 1 mg yesterday, due to the increased anxiety caused by his change and testosterone medication. Patient reports he went to his PCP today, but was told to come to the emergency room. Patient reports his prescription can be refilled on Sunday. He reports increased anxiety now, no auditory or visual hallucinations. No nausea, vomiting or diaphoresis. He reports that he does not drink alcohol, has been sober for 5 years.  Past Medical History  Diagnosis Date  . Anxiety    Past Surgical History  Procedure Laterality Date  . Knee surgery     No family history on file. Social History  Substance Use Topics  . Smoking status: Never Smoker   . Smokeless tobacco: None  . Alcohol Use: Yes    Review of Systems A 10 point review of systems was completed and was negative except for pertinent positives and negatives as mentioned in the history of present illness    Allergies  Review of patient's allergies indicates no known allergies.  Home Medications   Prior to Admission medications   Medication Sig Start Date End Date Taking? Authorizing Provider  clonazePAM (KLONOPIN) 1 MG tablet Take 1 mg by mouth 2 (two) times daily as needed.     Historical Provider, MD  dexmethylphenidate (FOCALIN) 10 MG tablet Take 10 mg by mouth daily as needed. For attention disorder    Historical Provider, MD   HYDROcodone-acetaminophen (VICODIN) 5-500 MG per tablet Take 1 tablet by mouth every 6 (six) hours as needed. One time dose prn pain. Pt used med from old rx    Historical Provider, MD  lamoTRIgine (LAMICTAL) 100 MG tablet Take 50 mg by mouth daily.     Historical Provider, MD  omeprazole (PRILOSEC) 10 MG capsule Take 10 mg by mouth daily.    Historical Provider, MD  testosterone cypionate (DEPOTESTOTERONE CYPIONATE) 100 MG/ML injection Inject 120 mg into the muscle See admin instructions. For IM use only 120 mg every 5 days    Historical Provider, MD   BP 145/85 mmHg  Pulse 76  Temp(Src) 98.6 F (37 C) (Oral)  Resp 18  Ht  (1.676 m)  Wt 88.451 kg  BMI 31.49 kg/m2  SpO2 99% Physical Exam  Constitutional: He is oriented to person, place, and time. He appears well-developed and well-nourished.  HENT:  Head: Normocephalic and atraumatic.  Mouth/Throat: Oropharynx is clear and moist.  Eyes: Conjunctivae are normal. Pupils are equal, round, and reactive to light. Right eye exhibits no discharge. Left eye exhibits no discharge. No scleral icterus.  Neck: Neck supple.  Cardiovascular: Normal rate, regular rhythm and normal heart sounds.   Pulmonary/Chest: Effort normal and breath sounds normal. No respiratory distress. He has no wheezes. He has no rales.  Abdominal: Soft. There is no tenderness.  Musculoskeletal: He exhibits no tenderness.  Neurological: He is alert and oriented to person, place, and time.  Cranial Nerves II-XII grossly intact  Skin: Skin is warm and dry. No rash noted.  Psychiatric: He has a normal mood and affect.  No agitation. No tremor. Goal oriented speech  Nursing note and vitals reviewed.   ED Course  Procedures (including critical care time) Labs Review Labs Reviewed - No data to display  Imaging Review No results found. I have personally reviewed and evaluated these images and lab results as part of my medical decision-making.   EKG  Interpretation None     Meds given in ED:  Medications - No data to display  New Prescriptions   No medications on file   Filed Vitals:   04/05/16 1102  BP: 145/85  Pulse: 76  Temp: 98.6 F (37 C)  TempSrc: Oral  Resp: 18  Height: 5\' 6"  (1.676 m)  Weight: 88.451 kg  SpO2: 99%    MDM  Andrew Johnson is a [age] y.o. male here for refill of Xanax prescription. Reports increased anxiety secondary to recent change and testosterone medication. Patient does not appear to be drug seeking and has not visited this emergency Department for this problem. Given one Valium in emergent department, discharged with #3 oral Valium to bridge until patient may fill prescription on Sunday. No evidence of emergent withdrawal at this time. CIWA 2 Final diagnoses:  Anxiety        Joycie PeekBenjamin Marqueze Ramcharan, PA-C 04/05/16 1218  Gwyneth SproutWhitney Plunkett, MD 04/05/16 [id number]

## 2022-12-24 ENCOUNTER — Ambulatory Visit (HOSPITAL_COMMUNITY)
Admission: EM | Admit: 2022-12-24 | Discharge: 2022-12-24 | Disposition: A | Discharge status: Home / Self Care | Payer: BC Managed Care – PPO | Attending: Psychiatry | Admitting: Psychiatry

## 2022-12-24 DIAGNOSIS — F22 Delusional disorders: Secondary | ICD-10-CM | POA: Diagnosis not present

## 2022-12-24 MED ORDER — OLANZAPINE 5 MG PO TABS: 5.0000 mg | ORAL_TABLET | Freq: Every day | ORAL | 0 refills | Status: AC

## 2022-12-24 NOTE — Progress Notes (Signed)
   12/24/22 1540  [place] (Walk-ins at [facility] only)  What Is the Reason for Your Visit/Call Today? ROUTINE: Andrew Johnson is a 42 y/o male that presents to the [facility] accompanied by his spouse "Andrew Johnson". Patient with a complaint of paranoia starting September 2023. He says that his spouse was on the way back home from a beach trip at that time, and he began accusing her of cheating on him. States that he has since been obsessively watching his wife on their house camera. Also, accusing her of sleeping around with men. He feels guilty for accusing his spouse, finds himself crying w/o reason, constantly asking himself "What did I do wrong", feels that people are now watching him, afraid that his wife will disappear and divorce him, and scared that cameras are set up in his house to watch him watch his spouse. Patient denies current SI and HI. However, has experienced SI and HI in the past. He has never acted out on his thoughts. He has a significant hx of alcohol use stating he drinks "all day" and has done so for several months. Last drink was yesterday, 12 pack. Prescribed Klonopin and Adderall by his PCP. States that he abuses both medications. His provider recently cut his medications. Denies that he has a psychiatrist and/or therapist. Patient is avoidant with answering questions appropriately. Also, has difficulty focusing on questions asked.  Have You Recently Had Any Thoughts About Hurting Yourself? No  Are You Planning to Commit Suicide/Harm Yourself At This time? No  Have you Recently Had Thoughts About [place]? No  Are You Planning To Harm Someone At This Time? No  Are you currently experiencing any auditory, visual or other hallucinations?  (Patient denies AVH's. However, seems very paranoid.)  Have You Used Any Alcohol or Drugs in the Past 24 Hours? Yes  How long ago did you use Drugs or Alcohol? Alcohol; Adderall; Klonopin  What Did You Use and How Much? Alcohol  -12 pack of beer; Adderrall -up to 6 (20mg 's); klonopin; varies (he would not provide an exact amt)  Do you have any current medical co-morbidities that require immediate attention? No  Clinician description of patient physical appearance/behavior: Calm and cooperative.  What Do You Feel Would Help You the Most Today? Stress Management;Medication(s);Alcohol or Drug Use Treatment  If access to [facility] was not available, would you have sought care in the Emergency Department? No  Determination of Need Routine (7 days)  Options For Referral Medication Management;Outpatient Therapy

## 2022-12-24 NOTE — Discharge Instructions (Addendum)
DO not take Zyprexa with klonopin or any other benzodiazapine. DO not drink with any of these medications this could be fatal.    The suicide prevention education provided includes the following: Suicide risk factors Suicide prevention and interventions National Suicide Hotline telephone number [facility] assessment telephone number [facility] Emergency Assistance [address] and/or Residential Mobile Crisis Unit telephone number  Remove weapons (e.g., guns, rifles, knives), all items previously/currently identified as safety concern.   Remove drugs/medications (over the counter, prescriptions, illicit drugs), all items previously/currently identified as a safety concern.

## 2022-12-24 NOTE — ED Provider Notes (Signed)
Behavioral Health Urgent Care Medical Screening Exam  Patient Name: Andrew Johnson MRN: [id number] Date of Evaluation: 12/24/22 Chief Complaint:  "I came here to get myself together". Diagnosis:  Final diagnoses:  Paranoia ([place])    History of Present illness: Andrew Johnson is a 42 y.o. male patient presented to [facility] as a walk in  accompanied by his wife Andrew Johnson with complaints of "I came here to get myself together".  Andrew Johnson, 42 y.o., male patient seen face to face by this provider and chart reviewed on 12/24/22.  Per chart review patient has a past psychiatric history of benzodiazepine dependence, GAD, panic disorder, ADHD and bipolar 1 disorder.  He is currently followed by Dr. Levonne Spiller.  He is prescribed Klonopin 1 mg 6 times per day, Adderall and Lamictal 200 mg daily.  Per note by Waldon Merl LCAS during triage " Patient with a complaint of paranoia starting September 2023. He says that his spouse was on the way back home from a beach trip at that time, and he began accusing her of cheating on him. States that he has since been obsessively watching his wife on their house camera. Also, accusing her of sleeping around with men. He feels guilty for accusing his spouse, finds himself crying w/o reason, constantly asking himself "What did I do wrong", feels that people are now watching him, afraid that his wife will disappear and divorce him, and scared that cameras are set up in his house to watch him watch his spouse. Patient denies current SI and HI. However, has experienced SI and HI in the past. He has never acted out on his thoughts. He has a significant hx of alcohol use stating he drinks "all day" and has done so for several months. Last drink was yesterday, 12 pack. Prescribed Klonopin and Adderall by his PCP. States that he abuses both medications. His provider recently cut his medications. Denies that he has a psychiatrist and/or therapist. Patient is avoidant with  answering questions appropriately. Also, has difficulty focusing on questions asked".   During evaluation Andrew Johnson is observed sitting in the assessment room in no acute distress.  He is fairly groomed and makes good eye contact.  he is alert/oriented x 4, cooperative, and attentive. His speech is clear, coherent, at a normal rate and tone.  He is tangential and has to be redirected in conversation.  He remains calm throughout the assessment.  He endorses feelings of depression and anxiety related to being paranoid all the time.  He has a dysphoric affect.  He is denying any concerns with sleep and reports he sleeps all night.  He denies any concerns with appetite, but then states he forgets to eat at times.  He is denying SI/HI/AVH.  He verbally contracts for safety.  His wife whom is present has no immediate safety concerns with patient returning home.  He does not appear to be responding to internal/external stimuli he has the delusion that his wife is cheating on him and will leave him soon.  He is extremely paranoid.  He has cameras throughout his house and spends most of his day sitting and watching the cameras.  He questions his wife about every little move that she makes in the camera that does not look normal to him.  He is paranoid that he is going to lose his children.   Discussed inpatient psychiatric admission patient declined.  Discussed admission to the continuous assessment unit for overnight  observation and he declined.  Discussed treatment/resources for alcohol use disorder and patient declined.  He does not meet criteria for involuntary commitment.  He is willing to consider outpatient therapy.  He is requesting any medication that could possibly help him with his paranoia.  Discussed Zyprexa 5 mg nightly.  Explained medication including side effects and how to take medication.  Patient verbalized understanding and is agreement.  A 7-day prescription was sent to patient's pharmacy of  choice and he was instructed to follow-up with his psychiatrist.   [facility] ED from 12/24/2022 in [place] No Risk       Psychiatric Specialty Exam  Presentation  General Appearance:Casual  Eye Contact:Good  Speech:Clear and Coherent  Speech Volume:Normal  Handedness:Right   Mood and Affect  Mood: Anxious; Depressed  Affect: Congruent   Thought Process  Thought Processes: Coherent  Descriptions of Associations:Tangential  Orientation:Full (Time, Place and Person)  Thought Content:Delusions; Paranoid Ideation  Diagnosis of Schizophrenia or Schizoaffective disorder in past: No   Hallucinations:None  Ideas of Reference:None  Suicidal Thoughts:No  Homicidal Thoughts:No   Sensorium  Memory: Immediate Good; Recent Good; Remote Good  Judgment: Fair  Insight: Fair   Community education officer  Concentration: Good  Attention Span: Good  Recall: Good  Fund of Knowledge: Good  Language: Good   Psychomotor Activity  Psychomotor Activity: Normal   Assets  Assets: Communication Skills; Desire for Improvement; Financial Resources/Insurance; Physical Health; Resilience; Social Support   Sleep  Sleep: Good  Number of hours: No data recorded  Physical Exam: Physical Exam Vitals and nursing note reviewed.  Constitutional:      General: He is not in acute distress.    Appearance: Normal appearance. He is well-developed.  HENT:     Head: Normocephalic and atraumatic.  Eyes:     General:        Right eye: No discharge.        Left eye: No discharge.     Conjunctiva/sclera: Conjunctivae normal.  Cardiovascular:     Rate and Rhythm: Normal rate.  Pulmonary:     Effort: Pulmonary effort is normal. No respiratory distress.  Musculoskeletal:        General: No swelling. Normal range of motion.     Cervical back: Normal range of motion.  Skin:    Coloration: Skin is not jaundiced  or pale.  Neurological:     Mental Status: He is alert and oriented to person, place, and time.  Psychiatric:        Attention and Perception: Attention and perception normal.        Mood and Affect: Mood is anxious and depressed.        Speech: Speech is tangential.        Behavior: Behavior normal. Behavior is cooperative.        Thought Content: Thought content is paranoid and delusional.        Cognition and Memory: Cognition normal.        Judgment: Judgment normal.    Review of Systems  Constitutional: Negative.   HENT: Negative.    Eyes: Negative.   Respiratory: Negative.    Cardiovascular: Negative.   Musculoskeletal: Negative.   Skin: Negative.   Neurological: Negative.   Psychiatric/Behavioral:  Positive for depression, substance abuse and suicidal ideas. The patient is nervous/anxious.    Blood pressure (!) 149/95, pulse (!) 112, temperature 97.6 F (36.4 C), temperature source Oral, resp. rate 18, SpO2  99 %. There is no height or weight on file to calculate BMI.  Musculoskeletal: Strength & Muscle Tone: within normal limits Gait & Station: normal Patient leans: N/A   [facility] MSE Discharge Disposition for Follow up and Recommendations: Based on my evaluation the patient does not appear to have an emergency medical condition and can be discharged with resources and follow up care in outpatient services for Medication Management and Individual Therapy  Discharge patient.  Provided patient with a 7-day prescription for Zyprexa 5 mg nightly for paranoia/delusions  Provided outpatient psychiatric resources for medication management and therapy.  Instructed patient to follow-up with his current psychiatrist or Repender Evelene Croon MD   Ardis Hughs, NP 12/24/2022, 10:42 PM

## 2023-06-15 ENCOUNTER — Encounter (HOSPITAL_BASED_OUTPATIENT_CLINIC_OR_DEPARTMENT_OTHER): Payer: Self-pay

## 2023-06-15 ENCOUNTER — Emergency Department (HOSPITAL_BASED_OUTPATIENT_CLINIC_OR_DEPARTMENT_OTHER)
Admission: EM | Admit: 2023-06-15 | Discharge: 2023-06-15 | Disposition: A | Discharge status: Home / Self Care | Payer: Self-pay | Attending: Emergency Medicine | Admitting: Emergency Medicine

## 2023-06-15 ENCOUNTER — Other Ambulatory Visit: Payer: Self-pay

## 2023-06-15 DIAGNOSIS — F13930 Sedative, hypnotic or anxiolytic use, unspecified with withdrawal, uncomplicated: Secondary | ICD-10-CM | POA: Insufficient documentation

## 2023-06-15 LAB — ACETAMINOPHEN LEVEL: Acetaminophen (Tylenol), Serum: <10 ug/mL — ABNORMAL LOW (ref 10–30)

## 2023-06-15 LAB — BASIC METABOLIC PANEL
Anion gap: 8 (ref 5–15)
BUN: 13 mg/dL (ref 6–20)
CO2: 25 mmol/L (ref 22–32)
Calcium: 9.9 mg/dL (ref 8.9–10.3)
Chloride: 104 mmol/L (ref 98–111)
Creatinine, Ser: 1.1 mg/dL (ref 0.61–1.24)
GFR, Estimated: >60 mL/min (ref >60)
Glucose, Bld: 107 mg/dL — ABNORMAL HIGH (ref 70–99)
Potassium: 4 mmol/L (ref 3.5–5.1)
Sodium: 137 mmol/L (ref 135–145)

## 2023-06-15 LAB — CBC WITH DIFFERENTIAL/PLATELET
Abs Immature Granulocytes: 0.04 10*3/uL (ref 0.00–0.07)
Basophils Absolute: 0 10*3/uL (ref 0.0–0.1)
Basophils Relative: 0 %
Eosinophils Absolute: 0.1 10*3/uL (ref 0.0–0.5)
Eosinophils Relative: 1 %
HCT: 51.1 % (ref 39.0–52.0)
Hemoglobin: 18 g/dL — ABNORMAL HIGH (ref 13.0–17.0)
Immature Granulocytes: 0 %
Lymphocytes Relative: 16 %
Lymphs Abs: 1.7 10*3/uL (ref 0.7–4.0)
MCH: 31.8 pg (ref 26.0–34.0)
MCHC: 35.2 g/dL (ref 30.0–36.0)
MCV: 90.3 fL (ref 80.0–100.0)
Monocytes Absolute: 0.7 10*3/uL (ref 0.1–1.0)
Monocytes Relative: 6 %
Neutro Abs: 8.4 10*3/uL — ABNORMAL HIGH (ref 1.7–7.7)
Neutrophils Relative %: 77 %
Platelets: 234 10*3/uL (ref 150–400)
RBC: 5.66 MIL/uL (ref 4.22–5.81)
RDW: 12.3 % (ref 11.5–15.5)
WBC: 11 10*3/uL — ABNORMAL HIGH (ref 4.0–10.5)
nRBC: 0 % (ref 0.0–0.2)

## 2023-06-15 LAB — SALICYLATE LEVEL: Salicylate Lvl: <7 mg/dL — ABNORMAL LOW (ref 7.0–30.0)

## 2023-06-15 LAB — MAGNESIUM: Magnesium: 1.9 mg/dL (ref 1.7–2.4)

## 2023-06-15 MED ORDER — LORAZEPAM 1 MG PO TABS
1.0000 mg | ORAL_TABLET | Freq: Once | ORAL | Status: AC
  Administered 2023-06-15: 1 mg via ORAL
  Filled 2023-06-15: qty 1

## 2023-06-15 MED ORDER — CLONAZEPAM 1 MG PO TABS: 1.0000 mg | ORAL_TABLET | Freq: Four times a day (QID) | ORAL | 0 refills | Status: AC | PRN

## 2023-06-15 MED ORDER — CLONAZEPAM 1 MG PO TABS
1.0000 mg | ORAL_TABLET | Freq: Once | ORAL | Status: DC
  Filled 2023-06-15: qty 1

## 2023-06-15 NOTE — Discharge Instructions (Addendum)
Make sure to call your psychiatrist on Monday to obtain a refill of your Klonopin.  Return for new or worsening symptoms

## 2023-06-15 NOTE — ED Notes (Signed)
Dc instructions reviewed with patient. Patient voiced understanding. Dc with belongings.  °

## 2023-06-15 NOTE — ED Triage Notes (Signed)
Patient arrives with complaints of worsening anxiety related to medication adjustments. Patient is currently tapering down from his Klonopin and he has been out of his med for 3 days. He reports anxiety, twitching, and concerned about having seizures.

## 2023-06-15 NOTE — ED Notes (Signed)
Administered 1 mg ativan,po. Pt asks for nurse to draw labs in 20  minutes as he is extremely anxious at this time.

## 2023-06-15 NOTE — ED Notes (Signed)
Pt stating he stills feels shaky, anxious after 1mg  ativan. PA aware

## 2023-06-15 NOTE — ED Provider Notes (Signed)
[place] EMERGENCY DEPARTMENT AT [facility] Provider Note   CSN: [id number] Arrival date & time: 06/15/23  1302     History  Chief Complaint  Patient presents with   Medication Problem   Anxiety    Andrew Johnson is a 42 y.o. male history of benzodiazepine dependence here for evaluation of withdrawal.  Patient states that he followed with a psychiatrist for many years where he was taking greater than 8 mg of Klonopin daily.  He subsequently went to a treatment program where he has been inpatient for the last 2 months in [place].  He transferred his care to outpatient for a provider in [place].  They have continued to try and decrease his Klonopin use.  Was on 4 mg daily the wrote him for titration down to 3 mg.  He was not able to titrate down without withdrawal.  He subsequently had to take 4 mg total daily.  He states he has been out of this over the last 2 days due to having to up his medications.  Significant other states that he called his provider in [place] on Friday where there is supposed to call in additional medication for the weekend however they did not.  He states he feels shaky, anxious, nauseous.  Occasionally has tenderness in bilateral ears.  He denies any aspirin use.  He does take trazodone chronically.  He has some twitches.  He is concerned about having seizures due to withdrawals.  Denies prior history of withdrawal seizures.  He denies any seizure-like activity.  No headache, chest pain, shortness of breath, abdominal pain.  He denies any SI, HI, AVH.  States he is only here to have a bridge until he can call his provider tomorrow  HPI     Home Medications Prior to Admission medications   Medication Sig Start Date End Date Taking? Authorizing Provider  clonazePAM (KLONOPIN) 1 MG tablet Take 1 tablet (1 mg total) by mouth 4 (four) times daily as needed for up to 2 days. 06/15/23 06/17/23  Teresa Nicodemus A, PA-C  lamoTRIgine (LAMICTAL) 100 MG  tablet Take 50 mg by mouth daily.     [provider]  OLANZapine (ZYPREXA) 5 MG tablet Take 1 tablet (5 mg total) by mouth at bedtime. 12/24/22 12/24/23  Ardis Hughs, NP  omeprazole (PRILOSEC) 10 MG capsule Take 10 mg by mouth daily.    [provider]      Allergies    Patient has no known allergies.    Review of Systems   Review of Systems  Constitutional: Negative.   HENT: Negative.    Respiratory: Negative.    Cardiovascular: Negative.   Gastrointestinal:  Positive for nausea.  Musculoskeletal: Negative.   Skin: Negative.   Neurological:  Positive for tremors.  All other systems reviewed and are negative.   Physical Exam Updated Vital Signs BP 139/85 (BP Location: Right Arm)   Pulse 70   Temp 98 F (36.7 C) (Temporal)   Resp 20   Ht 5\' 6"  (1.676 m)   Wt 97.5 kg   SpO2 100%   BMI 34.70 kg/m  Physical Exam Vitals and nursing note reviewed.  Constitutional:      General: He is not in acute distress.    Appearance: He is well-developed. He is not ill-appearing, toxic-appearing or diaphoretic.  HENT:     Head: Normocephalic and atraumatic.     Nose: Nose normal.     Mouth/Throat:     Mouth: Mucous  membranes are moist.  Eyes:     Pupils: Pupils are equal, round, and reactive to light.  Cardiovascular:     Rate and Rhythm: Normal rate and regular rhythm.     Pulses: Normal pulses.     Heart sounds: Normal heart sounds.  Pulmonary:     Effort: Pulmonary effort is normal. No respiratory distress.     Breath sounds: Normal breath sounds.  Abdominal:     General: Bowel sounds are normal. There is no distension.     Palpations: Abdomen is soft.  Musculoskeletal:        General: No swelling, tenderness, deformity or signs of injury. Normal range of motion.     Cervical back: Normal range of motion and neck supple.     Right lower leg: No edema.     Left lower leg: No edema.  Skin:    General: Skin is warm and dry.     Capillary Refill:  Capillary refill takes less than 2 seconds.  Neurological:     General: No focal deficit present.     Mental Status: He is alert and oriented to person, place, and time.     Cranial Nerves: No cranial nerve deficit.     Motor: No weakness.     Gait: Gait normal.  Psychiatric:     Comments: Flat affect No SI, HI, AVH.     ED Results / Procedures / Treatments   Labs (all labs ordered are listed, but only abnormal results are displayed) Labs Reviewed  CBC WITH DIFFERENTIAL/PLATELET - Abnormal; Notable for the following components:      Result Value   WBC 11.0 (*)    Hemoglobin 18.0 (*)    Neutro Abs 8.4 (*)    All other components within normal limits  BASIC METABOLIC PANEL - Abnormal; Notable for the following components:   Glucose, Bld 107 (*)    All other components within normal limits  ACETAMINOPHEN LEVEL - Abnormal; Notable for the following components:   Acetaminophen (Tylenol), Serum <10 (*)    All other components within normal limits  SALICYLATE LEVEL - Abnormal; Notable for the following components:   Salicylate Lvl <7.0 (*)    All other components within normal limits  MAGNESIUM    EKG None  Radiology No results found.  Procedures Procedures    Medications Ordered in ED Medications  LORazepam (ATIVAN) tablet 1 mg (1 mg Oral Given 06/15/23 1514)  LORazepam (ATIVAN) tablet 1 mg (1 mg Oral Given 06/15/23 1634)   ED Course/ Medical Decision Making/ A&P   42 year old here for evaluation of possible withdrawal.  He has history of benzodiazepine dependence.  Was inpatient for detox, taper down to 4 mg daily subsequently transferred care to an outpatient facility in [place].  He was not able to titrate down to 3 mg, subsequently he ran out of his Klonopin at home.  He feels anxious, twitchy nauseous.  He denies any SI, HI, AVH.  He denies any seizure-like activity.  Heart and lungs are clear.  Abdomen soft, nontender.  Will plan on checking basic labs, we do  not have Klonopin here however will provide with Ativan  Labs and imaging personally viewed and interpreted:  CBC leukocytosis 11.0 Metabolic panel without significant abnormality Acetaminophen less than 10 Mag 1.9 Salicylate neg  Discussed  results with patient, significant other in room.  Will provide prescription to bridge until can discuss with psychiatric provider tomorrow so he does not seizure from W/D.  Encouraged  compliance with benzodiazepine regimen from psychiatric provider.  He will return for new worsening symptoms  The patient has been appropriately medically screened and/or stabilized in the ED. I have low suspicion for any other emergent medical condition which would require further screening, evaluation or treatment in the ED or require inpatient management.  Feel he needs inpatient management for his withdrawal at this time as he is currently feeling improved after Ativan x 2.  Patient is hemodynamically stable and in no acute distress.  Patient able to ambulate in department prior to ED.  Evaluation does not show acute pathology that would require ongoing or additional emergent interventions while in the emergency department or further inpatient treatment.  I have discussed the diagnosis with the patient and answered all questions.  Pain is been managed while in the emergency department and patient has no further complaints prior to discharge.  Patient is comfortable with plan discussed in room and is stable for discharge at this time.  I have discussed strict return precautions for returning to the emergency department.  Patient was encouraged to follow-up with PCP/specialist refer to at discharge.                             Medical Decision Making Amount and/or Complexity of Data Reviewed Independent Historian: spouse External Data Reviewed: labs, radiology and notes. Labs: ordered. Decision-making details documented in ED Course.  Risk OTC drugs. Prescription drug  management. Diagnosis or treatment significantly limited by social determinants of health.          Final Clinical Impression(s) / ED Diagnoses Final diagnoses:  Benzodiazepine withdrawal without complication (HCC)    Rx / DC Orders ED Discharge Orders          Ordered    clonazePAM (KLONOPIN) 1 MG tablet  4 times daily PRN        06/15/23 1714              Anas Reister A, PA-C 06/15/23 1719    Ernie Avena, MD 06/16/23 [phone]

## 2024-01-08 ENCOUNTER — Emergency Department (HOSPITAL_COMMUNITY): Admission: EM | Admit: 2024-01-08 | Discharge: 2024-01-08 | Discharge status: Against Medical Advice | Payer: 59

## 2024-01-08 NOTE — ED Notes (Signed)
 Writer was told pt was dropped off by police, his wife arrived and pt was gone from the waiting room and has not returned.

## 2024-01-09 ENCOUNTER — Emergency Department (HOSPITAL_COMMUNITY)
Admission: EM | Admit: 2024-01-09 | Discharge: 2024-01-12 | Disposition: A | Discharge status: Other Acute Inpatient Hospital | Payer: 59 | Attending: Emergency Medicine | Admitting: Emergency Medicine

## 2024-01-09 DIAGNOSIS — F29 Unspecified psychosis not due to a substance or known physiological condition: Secondary | ICD-10-CM | POA: Insufficient documentation

## 2024-01-09 DIAGNOSIS — F3170 Bipolar disorder, currently in remission, most recent episode unspecified: Secondary | ICD-10-CM

## 2024-01-09 DIAGNOSIS — F1920 Other psychoactive substance dependence, uncomplicated: Secondary | ICD-10-CM | POA: Insufficient documentation

## 2024-01-09 DIAGNOSIS — F411 Generalized anxiety disorder: Secondary | ICD-10-CM | POA: Insufficient documentation

## 2024-01-09 DIAGNOSIS — Z20822 Contact with and (suspected) exposure to covid-19: Secondary | ICD-10-CM | POA: Insufficient documentation

## 2024-01-09 DIAGNOSIS — Z79899 Other long term (current) drug therapy: Secondary | ICD-10-CM

## 2024-01-09 DIAGNOSIS — F1320 Sedative, hypnotic or anxiolytic dependence, uncomplicated: Secondary | ICD-10-CM

## 2024-01-09 DIAGNOSIS — F319 Bipolar disorder, unspecified: Secondary | ICD-10-CM | POA: Insufficient documentation

## 2024-01-09 DIAGNOSIS — R945 Abnormal results of liver function studies: Secondary | ICD-10-CM | POA: Insufficient documentation

## 2024-01-09 DIAGNOSIS — I10 Essential (primary) hypertension: Secondary | ICD-10-CM | POA: Diagnosis not present

## 2024-01-09 DIAGNOSIS — R7989 Other specified abnormal findings of blood chemistry: Secondary | ICD-10-CM

## 2024-01-09 DIAGNOSIS — F909 Attention-deficit hyperactivity disorder, unspecified type: Secondary | ICD-10-CM

## 2024-01-09 DIAGNOSIS — F419 Anxiety disorder, unspecified: Secondary | ICD-10-CM | POA: Diagnosis present

## 2024-01-09 LAB — COMPREHENSIVE METABOLIC PANEL
ALT: 52 U/L — ABNORMAL HIGH (ref 0–44)
AST: 98 U/L — ABNORMAL HIGH (ref 15–41)
Albumin: 4.8 g/dL (ref 3.5–5.0)
Alkaline Phosphatase: 62 U/L (ref 38–126)
Anion gap: 14 (ref 5–15)
BUN: 18 mg/dL (ref 6–20)
CO2: 20 mmol/L — ABNORMAL LOW (ref 22–32)
Calcium: 9.9 mg/dL (ref 8.9–10.3)
Chloride: 102 mmol/L (ref 98–111)
Creatinine, Ser: 1.13 mg/dL (ref 0.61–1.24)
GFR, Estimated: >60 mL/min (ref >60)
Glucose, Bld: 97 mg/dL (ref 70–99)
Potassium: 4.1 mmol/L (ref 3.5–5.1)
Sodium: 136 mmol/L (ref 135–145)
Total Bilirubin: 2.3 mg/dL — ABNORMAL HIGH (ref 0.0–1.2)
Total Protein: 8 g/dL (ref 6.5–8.1)

## 2024-01-09 LAB — CBC WITH DIFFERENTIAL/PLATELET
Abs Immature Granulocytes: 0.07 10*3/uL (ref 0.00–0.07)
Basophils Absolute: 0 10*3/uL (ref 0.0–0.1)
Basophils Relative: 0 %
Eosinophils Absolute: 0 10*3/uL (ref 0.0–0.5)
Eosinophils Relative: 0 %
HCT: 51.5 % (ref 39.0–52.0)
Hemoglobin: 16.3 g/dL (ref 13.0–17.0)
Immature Granulocytes: 0 %
Lymphocytes Relative: 12 %
Lymphs Abs: 1.9 10*3/uL (ref 0.7–4.0)
MCH: 26.4 pg (ref 26.0–34.0)
MCHC: 31.7 g/dL (ref 30.0–36.0)
MCV: 83.3 fL (ref 80.0–100.0)
Monocytes Absolute: 1 10*3/uL (ref 0.1–1.0)
Monocytes Relative: 6 %
Neutro Abs: 12.8 10*3/uL — ABNORMAL HIGH (ref 1.7–7.7)
Neutrophils Relative %: 82 %
Platelets: 347 10*3/uL (ref 150–400)
RBC: 6.18 MIL/uL — ABNORMAL HIGH (ref 4.22–5.81)
RDW: 14 % (ref 11.5–15.5)
WBC: 15.8 10*3/uL — ABNORMAL HIGH (ref 4.0–10.5)
nRBC: 0 % (ref 0.0–0.2)

## 2024-01-09 LAB — RESP PANEL BY RT-PCR (RSV, FLU A&B, COVID)  RVPGX2
Influenza A by PCR: NEGATIVE
Influenza B by PCR: NEGATIVE
Resp Syncytial Virus by PCR: NEGATIVE
SARS Coronavirus 2 by RT PCR: NEGATIVE

## 2024-01-09 LAB — ETHANOL: Alcohol, Ethyl (B): <10 mg/dL (ref <10)

## 2024-01-09 LAB — RAPID URINE DRUG SCREEN, HOSP PERFORMED
Amphetamines: POSITIVE — AB
Barbiturates: NOT DETECTED
Benzodiazepines: POSITIVE — AB
Cocaine: NOT DETECTED
Opiates: NOT DETECTED
Tetrahydrocannabinol: NOT DETECTED

## 2024-01-09 LAB — SALICYLATE LEVEL: Salicylate Lvl: <7 mg/dL — ABNORMAL LOW (ref 7.0–30.0)

## 2024-01-09 LAB — ACETAMINOPHEN LEVEL: Acetaminophen (Tylenol), Serum: <10 ug/mL — ABNORMAL LOW (ref 10–30)

## 2024-01-09 MED ORDER — LORAZEPAM 1 MG PO TABS
1.0000 mg | ORAL_TABLET | ORAL | Status: DC | PRN
  Administered 2024-01-09: 1 mg via ORAL
  Filled 2024-01-09: qty 1

## 2024-01-09 MED ORDER — AMLODIPINE BESYLATE 5 MG PO TABS
5.0000 mg | ORAL_TABLET | Freq: Every day | ORAL | Status: DC
  Administered 2024-01-09: 5 mg via ORAL
  Filled 2024-01-09: qty 1

## 2024-01-09 MED ORDER — PANTOPRAZOLE SODIUM 40 MG PO TBEC
40.0000 mg | DELAYED_RELEASE_TABLET | Freq: Every day | ORAL | Status: DC
  Administered 2024-01-10 – 2024-01-11 (×2): 40 mg via ORAL
  Filled 2024-01-09 (×2): qty 1

## 2024-01-09 MED ORDER — FLUOXETINE HCL 10 MG PO CAPS
10.0000 mg | ORAL_CAPSULE | Freq: Every day | ORAL | Status: DC
  Administered 2024-01-09 – 2024-01-11 (×3): 10 mg via ORAL
  Filled 2024-01-09 (×3): qty 1

## 2024-01-09 MED ORDER — HYDROXYZINE HCL 25 MG PO TABS
25.0000 mg | ORAL_TABLET | Freq: Every day | ORAL | Status: DC | PRN
  Administered 2024-01-10 – 2024-01-11 (×2): 25 mg via ORAL
  Filled 2024-01-09 (×2): qty 1

## 2024-01-09 MED ORDER — HYDROXYZINE HCL 25 MG PO TABS: 25.0000 mg | ORAL_TABLET | ORAL | Status: DC

## 2024-01-09 MED ORDER — THIAMINE MONONITRATE 100 MG PO TABS
100.0000 mg | ORAL_TABLET | Freq: Every day | ORAL | Status: DC
  Administered 2024-01-09 – 2024-01-11 (×3): 100 mg via ORAL
  Filled 2024-01-09 (×3): qty 1

## 2024-01-09 MED ORDER — ADULT MULTIVITAMIN W/MINERALS CH
1.0000 | ORAL_TABLET | Freq: Every day | ORAL | Status: DC
  Administered 2024-01-09 – 2024-01-11 (×3): 1 via ORAL
  Filled 2024-01-09 (×3): qty 1

## 2024-01-09 MED ORDER — CLONAZEPAM 1 MG PO TABS
1.0000 mg | ORAL_TABLET | Freq: Four times a day (QID) | ORAL | Status: DC | PRN
  Administered 2024-01-09 – 2024-01-12 (×5): 1 mg via ORAL
  Filled 2024-01-09: qty 1
  Filled 2024-01-09: qty 2
  Filled 2024-01-09 (×3): qty 1

## 2024-01-09 MED ORDER — THIAMINE HCL 100 MG/ML IJ SOLN
100.0000 mg | Freq: Every day | INTRAMUSCULAR | Status: DC
  Filled 2024-01-09: qty 2

## 2024-01-09 MED ORDER — TESTOSTERONE CYPIONATE 200 MG/ML IM SOLN
140.0000 mg | INTRAMUSCULAR | Status: DC
Start: 2024-01-09 — End: 2024-01-09

## 2024-01-09 MED ORDER — OLANZAPINE 5 MG PO TABS
5.0000 mg | ORAL_TABLET | Freq: Every day | ORAL | Status: DC
  Administered 2024-01-09 – 2024-01-10 (×2): 5 mg via ORAL
  Filled 2024-01-09 (×3): qty 1

## 2024-01-09 MED ORDER — LORAZEPAM 2 MG/ML IJ SOLN: 1.0000 mg | INTRAMUSCULAR | Status: DC | PRN

## 2024-01-09 MED ORDER — FOLIC ACID 1 MG PO TABS
1.0000 mg | ORAL_TABLET | Freq: Every day | ORAL | Status: DC
  Administered 2024-01-09 – 2024-01-11 (×3): 1 mg via ORAL
  Filled 2024-01-09 (×3): qty 1

## 2024-01-09 MED ORDER — AMPHETAMINE-DEXTROAMPHETAMINE 10 MG PO TABS: 10.0000 mg | ORAL_TABLET | Freq: Four times a day (QID) | ORAL | Status: DC | PRN

## 2024-01-09 MED ORDER — LAMOTRIGINE 100 MG PO TABS
200.0000 mg | ORAL_TABLET | Freq: Every day | ORAL | Status: DC
  Administered 2024-01-09 – 2024-01-11 (×3): 200 mg via ORAL
  Filled 2024-01-09 (×3): qty 2

## 2024-01-09 MED ORDER — HYDROXYZINE HCL 25 MG PO TABS
25.0000 mg | ORAL_TABLET | Freq: Every day | ORAL | Status: DC
  Administered 2024-01-09 – 2024-01-11 (×3): 25 mg via ORAL
  Filled 2024-01-09 (×4): qty 1

## 2024-01-09 MED ORDER — TADALAFIL 5 MG PO TABS: 5.0000 mg | ORAL_TABLET | Freq: Every day | ORAL | Status: DC | PRN

## 2024-01-09 MED ORDER — OMEPRAZOLE MAGNESIUM 20 MG PO TBEC: 20.0000 mg | DELAYED_RELEASE_TABLET | Freq: Every day | ORAL | Status: DC

## 2024-01-09 NOTE — Consult Note (Signed)
 [facility] Psychiatric Consult Initial  Patient Name: .Andrew Johnson  MRN: [id number]  DOB: 13-Nov-1981  Consult Order details:  Orders (From admission, onward)     Start     Ordered   01/09/24 1123  CONSULT TO CALL ACT TEAM       Ordering Provider: Yolande Lamar BROCKS, MD  Provider:  (Not yet assigned)  Question:  Reason for Consult?  Answer:  Psych consult   01/09/24 1123             Mode of Visit: In person    Psychiatry Consult Evaluation  Service Date: January 09, 2024 LOS:  LOS: 0 days  Chief Complaint  paranoid behavior  Primary Psychiatric Diagnoses  Bipolar disorder 2.   Benzodiazepine dependence  3.   GAD 4.   ADHD  Assessment  Andrew Johnson is a 43 y.o. male admitted: Presented to the ED on 01/09/2024  9:10 AM for paranoid behavior. He carries the psychiatric diagnoses of Bipolar disorder, Benzodiazepine dependence, and GAD and has a past medical history of  hypogonadism.   His current presentation of disorganized and tangential speech is most consistent with mania. He meets criteria for inpatient based on disorganized and tangential speech.  Current outpatient psychotropic medications include Adderall, Klonopin , Prozac , Atarax , Lamictal , and historically he has had a positive response to these medications. He was compliant with medications prior to admission as evidenced by patient wife. On initial examination, patient is drowsy, but pleasant. Please see plan below for detailed recommendations.    Diagnoses:  Active Hospital problems: Principal Problem:   Bipolar disorder (HCC) Active Problems:   GAD (generalized anxiety disorder)   Polysubstance dependence (HCC)    Plan   ## Psychiatric Medication Recommendations:  Continue patient home medications   ## Medical Decision Making Capacity:  Patient is her own legal guardian  ## Further Work-up:  -- No further work up needed EKG or UDS -- most recent EKG on 01/09/24 had QtC of 434 -- Pertinent  labwork reviewed earlier this admission includes: CMP, BNP, EKG, UDS   ## Disposition:-- We recommend inpatient psychiatric hospitalization after medical hospitalization. Patient has been involuntarily committed on 01/09/24.    ## Behavioral / Environmental: -To minimize splitting of staff, assign one staff person to communicate all information from the team when feasible. or Utilize compassion and acknowledge the patient's experiences while setting clear and realistic expectations for care.    ## Safety and Observation Level:  - Based on my clinical evaluation, I estimate the patient to be at low risk of self harm in the current setting. - At this time, we recommend  routine. This decision is based on my review of the chart including patient's history and current presentation, interview of the patient, mental status examination, and consideration of suicide risk including evaluating suicidal ideation, plan, intent, suicidal or self-harm behaviors, risk factors, and protective factors. This judgment is based on our ability to directly address suicide risk, implement suicide prevention strategies, and develop a safety plan while the patient is in the clinical setting. Please contact our team if there is a concern that risk level has changed.  CSSR Risk Category:C-SSRS RISK CATEGORY: No Risk  Suicide Risk Assessment: Patient has following modifiable risk factors for suicide: social isolation, recklessness, and medication noncompliance, which we are addressing by recommending inpatient psychiatric admission. Patient has following non-modifiable or demographic risk factors for suicide: male gender and psychiatric hospitalization Patient has the following protective factors against suicide: Access to  outpatient mental health care, Supportive family, and Minor children in the home  Thank you for this consult request. Recommendations have been communicated to the primary team.  We will recommend inpatient  admission for this patient and continue to follow patient at this time.   CATHALEEN ADAM, PMHNP       History of Present Illness  Relevant Aspects of Hospital ED Course:  Admitted on 01/09/2024 for paranoid behavior  Patient Report:  Andrew Johnson, 43 y.o., male patient seen face to face by this provider, consulted with Dr. Zouev; and chart reviewed on 01/09/24.  On evaluation Andrew Johnson reports that he is just tired, and he wanted to get some rest. Patient began rambling stating ever since things shifted in my mind, I went on a journey, in my mind, because I think my wife and daughter are going to leave me.Patient states he lives with his wife and 62 year old daughter, states that he used to work at [employer] but was laid off in 2000, and currently does not work.  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  Patient states that he has been changing his mind on what he wanted to do with his life, feels like he has not lived his life to the fullest because of his past criminal charges.  Patient states that he used to be an alcoholic, and states that because he put the alcohol before himself he never did get his criminal record expunged.  Patient denies having any alcohol in 1-1/2 years.  Patient feels that his mind is slipping away, states he went from depression to snapping out of it, holding all of this baggage in, states his mother passed away last year, and his father is currently in the hospital after having a stroke, patient states he has not grieved or had any therapy for his depression.  Patient states he does have a psychiatrist who helps him with his medication management.  This provider asked patient where he walked to the previous night after he left the hospital and did not return home, he states that since the polar  shift and changes in the alignment of the stars in the bed he has been feeling off.  Patient denies using any illicit drugs or alcohol, UDS is positive for amphetamines and benzodiazepines, patient endorses taking Adderall and Klonopin  as scheduled medications.  BAL less than 10. Patient states his appetite and sleep are poor. Patient states he takes Lamictal  200 mg daily, Adderall 40 mg daily, Klonopin , he states he also takes Prozac  to cut back the Adderall.  During evaluation Andrew Johnson is laying in his bed and appears to be in no acute distress. He is alert, oriented x 3, calm, cooperative and attentive. His mood is euthymic with congruent affect. Thought process is coherent and disorganized. Thought content is slightly tangential.  He denies auditory and visual hallucinations. No indication that he is responding to internal stimuli during this assessment.  No delusions elicited during this assessment. He denies suicidal ideations. He denies homicidal ideations.   Psych ROS:  Depression: Positive Anxiety:  Positive Mania (lifetime and current): Positive Psychosis: (lifetime and current): Denies  Collateral information:  [facility] coordinator Chesley spoke with patient wife.see note  Review of Systems  Psychiatric/Behavioral:         Delusional, paranoid  Psychiatric and Social History  Psychiatric History:  Information collected from patient and wife  Prev Dx/Sx: Bipolar disorder, GAD Current Psych Provider: Dr. Daylene Ka at [facility] Meds (current): see note  Previous Med Trials: Yes Therapy: No  Prior Psych Hospitalization: Patient denies  Prior Self Harm: Patient denies  Prior Violence: Patient denies  Family Psych History: Denies  Family Hx suicide: Denies  Social History:  Developmental Hx: Patient appears appropriate age Educational Hx: Patient did not graduate high school Occupational Hx: Unemployed  Legal Hx: Denies Living Situation: Lives  with wife and 1 year old daughter Spiritual Hx: None Access to weapons/lethal means: Denies    Substance History Alcohol: Last drink 2023  Type of alcohol beer Last Drink 2023 Number of drinks per day N/A History of alcohol withdrawal seizures Denies History of DT's Denies Tobacco: Denies Illicit drugs: Denies Prescription drug abuse: Per wife she feels that patient has been abusing his Klonopin  and Adderall Rehab hx: Denies  Exam Findings  Physical Exam:  Vital Signs:  Temp:  [98 F (36.7 C)-98.4 F (36.9 C)] 98 F (36.7 C) (02/07 0930) Pulse Rate:  [77-94] 87 (02/07 1445) Resp:  [22-26] 23 (02/07 1445) BP: (89-196)/(53-132) 89/53 (02/07 1445) SpO2:  [96 %-99 %] 96 % (02/07 1445) Blood pressure (!) 89/53, pulse 87, temperature 98 F (36.7 C), temperature source Rectal, resp. rate (!) 23, SpO2 96%. There is no height or weight on file to calculate BMI.  Physical Exam Vitals and nursing note reviewed. Exam conducted with a chaperone present.  Neurological:     Mental Status: He is alert.  Psychiatric:        Attention and Perception: Attention normal.        Mood and Affect: Mood is depressed. Affect is labile and flat.        Speech: Speech normal.        Behavior: Behavior is cooperative.        Thought Content: Thought content is paranoid and delusional.        Cognition and Memory: Memory normal.        Judgment: Judgment is impulsive and inappropriate.     Mental Status Exam: General Appearance: Disheveled  Orientation:  Full (Time, Place, and Person)  Memory:  Immediate;   Fair Remote;   Fair  Concentration:  Concentration: Fair and Attention Span: Fair  Recall:  Fair  Attention  Fair  Eye Contact:  Minimal  Speech:  Clear and Coherent  Language:  Fair  Volume:  Decreased  Mood: sad  Affect:  Flat and Labile  Thought Process:  Disorganized  Thought Content:  Obsessions and Paranoid Ideation  Suicidal Thoughts:  No  Homicidal Thoughts:  No   Judgement:  Impaired  Insight:  Lacking  Psychomotor Activity:  Normal  Akathisia:  No  Fund of Knowledge:  Fair      Assets:  Manufacturing Systems Engineer Desire for Improvement Housing Social Support  Cognition:  Impaired,  Moderate  ADL's:  Intact  AIMS (if indicated):        Other History   These have been pulled in through the EMR, reviewed, and updated if appropriate.  Family History:  The patient's family history is not on file.  Medical History: Past Medical History:  Diagnosis Date   Anxiety     Surgical History: Past Surgical History:  Procedure Laterality Date   KNEE SURGERY       Medications:   Current Facility-Administered Medications:  folic acid  (FOLVITE ) tablet 1 mg, 1 mg, Oral, Daily, Yolande Lamar BROCKS, MD, 1 mg at 01/09/24 1032   LORazepam  (ATIVAN ) tablet 1-4 mg, 1-4 mg, Oral, Q1H PRN **OR** LORazepam  (ATIVAN ) injection 1-4 mg, 1-4 mg, Intravenous, Q1H PRN, Yolande Lamar BROCKS, MD   multivitamin with minerals tablet 1 tablet, 1 tablet, Oral, Daily, Yolande Lamar BROCKS, MD, 1 tablet at 01/09/24 1032   thiamine  (VITAMIN B1) tablet 100 mg, 100 mg, Oral, Daily, 100 mg at 01/09/24 1031 **OR** thiamine  (VITAMIN B1) injection 100 mg, 100 mg, Intravenous, Daily, Yolande Lamar BROCKS, MD  Current Outpatient Medications:    amphetamine -dextroamphetamine  (ADDERALL) 20 MG tablet, Take 10 mg by mouth every 6 (six) hours as needed (to focus)., Disp: , Rfl:    clonazePAM  (KLONOPIN ) 1 MG tablet, Take 1 tablet (1 mg total) by mouth 4 (four) times daily as needed for up to 2 days. (Patient taking differently: Take 1 mg by mouth 3 (three) times daily before meals.), Disp: 8 tablet, Rfl: 0   FLUoxetine  (PROZAC ) 10 MG capsule, Take 10 mg by mouth daily., Disp: , Rfl:    hydrOXYzine  (ATARAX ) 25 MG tablet, Take 25-50 mg by mouth See admin instructions. Take 25-50 mg by mouth at bedtime and an additional 25 mg once a day as needed for anxiety, Disp: , Rfl:    lamoTRIgine   (LAMICTAL ) 200 MG tablet, Take 200 mg by mouth in the morning., Disp: , Rfl:    Multiple Vitamins-Minerals (MENS MULTIVITAMIN) TABS, Take 1 tablet by mouth daily with breakfast., Disp: , Rfl:    PRILOSEC  OTC 20 MG tablet, Take 20 mg by mouth daily before breakfast., Disp: , Rfl:    OLANZapine  (ZYPREXA ) 5 MG tablet, Take 1 tablet (5 mg total) by mouth at bedtime. (Patient not taking: Reported on 01/09/2024), Disp: 7 tablet, Rfl: 0   tadalafil  (CIALIS ) 5 MG tablet, Take 5 mg by mouth daily as needed for erectile dysfunction., Disp: , Rfl:    testosterone  cypionate (DEPOTESTOSTERONE CYPIONATE) 200 MG/ML injection, Inject 140 mg into the muscle every 7 (seven) days., Disp: , Rfl:   Allergies: Allergies  Allergen Reactions   Other Other (See Comments)    Clonazepam  Tablets made by [employer]  sent the patient to the E.D., as it did nothing for him. It was INEFFECTIVE. He cannot take this brand. He tolerates other brands fine.    Arron Mcnaught MOTLEY-MANGRUM, PMHNP
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
## 2024-01-09 NOTE — Progress Notes (Signed)
 [facility] LCSW Progress Note   01/09/2024    8:17 PM  DAMIEL BARTHOLD   [id number]   Type of Contact and Topic:  Psychiatric Bed Placement   Pt accepted [facility]     Patient meets inpatient criteria per Cathaleen Adam, NP  The attending provider will be Dr. Trena Islam   Call report to [phone]  Rumalda Pleva, Paramedic @ [facility] ED notified.     Pt scheduled  to arrive at [facility] TOMORROW (01/10/2024) AFTER 0900.    Bunnie Gallop, MSW, LCSW-A  8:19 PM 01/09/2024

## 2024-01-09 NOTE — ED Notes (Signed)
 Pt's wife is going to take belonging with her in she leaves.
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
## 2024-01-09 NOTE — ED Triage Notes (Signed)
 Per EMS. Pt here yesterday for mental health eval. Left waiting room without being seen. Per wife, pt started getting paranoid yesterday. Left home, reported missing overnight. AO x 4. Hx bipolar, depression, anxiety.   BP 140/60 O2 95 RA T 97.4 HR 76 CBG 135

## 2024-01-09 NOTE — ED Notes (Addendum)
 New CIWA score was calculated with assistance from P. Diane Forte, Charity fundraiser. Due to the total score, I notified J. Motley-Mangrum, NP. Medications to be adjusted accordingly

## 2024-01-09 NOTE — Progress Notes (Signed)
 Patient has been denied by [facility] due to no appropriate beds available. Patient meets BH inpatient criteria per Cathaleen Adam, NP. Patient has been faxed out to the following facilities:   [facility] [address] [place] [address] [phone]  [facility] [address] [place] [address] [phone]  [facility] [address] [place] [address] [phone]  [facility] Patient Placement [facility], [place] [phone]  [facility] [address] [place] [address] [phone]  [facility] [address] [place] [address] [phone]  [facility] EFAX [address] [place], [place] [phone]  [facility] [address], [place] [address] [phone]  [facility] [address] [place] [address] [phone]  [facility] [address] [place] [address] [phone]  [facility] [address] [place] [address] [phone]  [facility] [place] [address] [phone]  [facility] [address] [place] [address] [phone]  [facility] [address], [place] [address] [phone]  [facility] [address] [place], [place] [address] [phone]  [facility] [address] [place] [address] [phone]  [facility] [address], [place] [address] [phone]  [facility] [address],  [place] [address] [phone]   Bunnie Gallop, MSW, LCSW-A  6:19 PM 01/09/2024
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
## 2024-01-09 NOTE — ED Notes (Signed)
 Pt accepted to [facility]. Accepting MD Berl Breed. Report # will be [phone]

## 2024-01-09 NOTE — Discharge Instructions (Signed)
 Please follow-up with your primary doctor about your abnormal liver function tests.
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
## 2024-01-09 NOTE — ED Notes (Signed)
 Pt started pulling on exit doors asking to be let out for his surgery. Pt informed he was not going to surgery at this time and was directed back to his room. Pt had been calm and relaxed in his room prior to this point. A few minutes later, pt approached desk yelling and swinging his arms around. Pt yelled at staff about god and about being telepathic. Pt then said he was the biggest thief and the president. Pt asked to call his wife but was informed it was after hours for such. Pt verbally deescalated and returned to his room. Pt remained in his room but continued talking to himself.

## 2024-01-10 NOTE — ED Notes (Signed)
 I made multiple attempts to contact the sheriffs department for transport with no answer and no opportunity to leave a voicemail. Security personnel were able to speak with someone in the transportation department and was advised they were busy and would not be available to transport today

## 2024-01-10 NOTE — ED Notes (Signed)
 Patient report was called to Jeanett Miles, RN at [facility]

## 2024-01-10 NOTE — ED Notes (Signed)
 Left voicemail for [facility] to transport.

## 2024-01-10 NOTE — ED Provider Notes (Signed)
 Emergency Medicine Observation Re-evaluation Note  Andrew Johnson is a 43 y.o. male, seen on rounds today.  Pt initially presented to the ED for complaints of No chief complaint on file. Currently, the patient is awake and alert.  Pt presented with paranoia and manic behavior yesterday.  Family thinks he's not been taking his meds properly.  Pt did not sleep much last night.  Staff reports that he's been pacing the halls.  Physical Exam  BP (!) 148/77   Pulse 100   Temp 98.9 F (37.2 C) (Oral)   Resp 20   SpO2 98%  Physical Exam General: awake and alert Cardiac: rr Lungs: clear Psych: calm  ED Course / MDM  EKG:EKG Interpretation Date/Time:  Friday January 09 2024 09:25:41 EST Ventricular Rate:  82 PR Interval:  169 QRS Duration:  98 QT Interval:  371 QTC Calculation: 434 R Axis:   -12  Text Interpretation: Sinus rhythm Confirmed by Yolande Charleston [phone]) on 01/09/2024 10:13:12 AM  I have reviewed the labs performed to date as well as medications administered while in observation.  Recent changes in the last 24 hours include acceptance to [facility].  Plan  Current plan is for inpatient psych.    Dean Clarity, MD 01/10/24 [phone]

## 2024-01-10 NOTE — ED Notes (Signed)
 Report attempted to be called x3 to [facility]. Staff given additional # [phone] to call. Called with no success.

## 2024-01-11 ENCOUNTER — Encounter (HOSPITAL_COMMUNITY): Payer: Self-pay | Admitting: Emergency Medicine

## 2024-01-11 NOTE — ED Provider Notes (Signed)
 Emergency Medicine Observation Re-evaluation Note  Andrew Johnson is a 43 y.o. male, seen on rounds today.  Pt initially presented to the ED for complaints of No chief complaint on file. Currently, the patient is calm and cooperative.  Physical Exam  BP 123/68 (BP Location: Left Arm)   Pulse 89   Temp 97.6 F (36.4 C) (Oral)   Resp 16   SpO2 100%  Physical Exam General: Awake. Alert. No acute distress Cardiac: Regular rate rhythm Lungs: Clear to auscultation bilaterally Psych: Calm and cooperative  ED Course / MDM  EKG:EKG Interpretation Date/Time:  Friday January 09 2024 09:25:41 EST Ventricular Rate:  82 PR Interval:  169 QRS Duration:  98 QT Interval:  371 QTC Calculation: 434 R Axis:   -12  Text Interpretation: Sinus rhythm Confirmed by Yolande Charleston [phone]) on 01/09/2024 10:13:12 AM  I have reviewed the labs performed to date as well as medications administered while in observation.  Recent changes in the last 24 hours include no acute events.  Plan  Current plan is for patient has been accepted for inpatient hospitalization at [facility] awaiting bed and transport.    Pamella Ozell LABOR, DO 01/11/24 1251

## 2024-01-11 NOTE — ED Notes (Signed)
 Have called Sheriff for transport twice and left message.  No return phone call.

## 2024-01-11 NOTE — ED Notes (Signed)
 Called sheriff department to inquire about transport, no answer, left VM.

## 2024-01-11 NOTE — ED Notes (Signed)
 Patient is resting comfortably.

## 2024-01-11 NOTE — ED Notes (Signed)
 Patient has been alert this shift. Patient has been medication compliant.   Patient has been calm and cooperative with staff. Patient quiet and guarded. No suicidal or homicidal ideation noted. Patient anxious and tearful at times.

## 2024-01-12 MED ORDER — ZOLPIDEM TARTRATE 5 MG PO TABS
10.0000 mg | ORAL_TABLET | Freq: Once | ORAL | Status: AC
  Administered 2024-01-12: 10 mg via ORAL
  Filled 2024-01-12: qty 2

## 2024-01-12 NOTE — ED Notes (Signed)
 Called sheriff's department to inquire about transportation, no answer, will notify next shift.

## 2024-01-12 NOTE — ED Notes (Signed)
 Patient report was just called to [facility], Facilities manager, at [facility]

## 2024-01-12 NOTE — ED Notes (Addendum)
 Pt calm and cooperative, pleasant, medication compliant. No aggressive behavior noted. Reports anxiety. Pt was provided PRN's as noted for anxiety throughout the shift.

## 2024-01-12 NOTE — ED Provider Notes (Addendum)
  Physical Exam  BP (!) 149/86   Pulse 74   Temp 97.6 F (36.4 C) (Oral)   Resp 18   SpO2 100%   Physical Exam  Procedures  Procedures  ED Course / MDM   Clinical Course as of 01/12/24 0738  Fri Jan 09, 2024  1914 Attempted to contact wife for collateral information without response [RP]  1101 Wife at the bedside. States that he hasn't slept well. Also has been talking about saving the world, secrets, and important things going on at airports.  [RP]    Clinical Course User Index [RP] Ninetta Basket, MD   Medical Decision Making Amount and/or Complexity of Data Reviewed Labs: ordered.  Risk OTC drugs. Prescription drug management.   Patient has been in the ER for 70 hours.  However has been accepted at [facility] and waiting in the ER around 48 hours for transport.   Transport now on the way.  Accepted at [facility].  Dr. Amy Kansky excepting.  Reevaluated prior to transfer.        Mozell Arias, MD 01/12/24 7829    Mozell Arias, MD 01/12/24 [phone]
